# Patient Record
Sex: Female | Born: 1970 | Race: Asian | Hispanic: No | Marital: Married | Smoking: Never smoker
Health system: Southern US, Community
[De-identification: ages and names within clinical notes are randomized; demographics above are authoritative.]

## PROBLEM LIST (undated history)

## (undated) DIAGNOSIS — I10 Essential (primary) hypertension: Secondary | ICD-10-CM

---

## 2000-08-07 ENCOUNTER — Other Ambulatory Visit: Admission: RE | Admit: 2000-08-07 | Discharge: 2000-08-07 | Payer: Self-pay | Admitting: Gynecology

## 2002-03-07 ENCOUNTER — Ambulatory Visit (HOSPITAL_COMMUNITY): Admission: RE | Admit: 2002-03-07 | Discharge: 2002-03-07 | Payer: Self-pay | Admitting: Obstetrics and Gynecology

## 2002-03-07 ENCOUNTER — Encounter: Payer: Self-pay | Admitting: Obstetrics and Gynecology

## 2003-01-13 ENCOUNTER — Encounter: Payer: Self-pay | Admitting: Family Medicine

## 2003-01-13 ENCOUNTER — Ambulatory Visit (HOSPITAL_COMMUNITY): Admission: RE | Admit: 2003-01-13 | Discharge: 2003-01-13 | Payer: Self-pay | Admitting: Family Medicine

## 2003-04-21 ENCOUNTER — Emergency Department (HOSPITAL_COMMUNITY): Admission: EM | Admit: 2003-04-21 | Discharge: 2003-04-21 | Payer: Self-pay | Admitting: Emergency Medicine

## 2005-03-29 ENCOUNTER — Inpatient Hospital Stay (HOSPITAL_COMMUNITY): Admission: AD | Admit: 2005-03-29 | Discharge: 2005-03-29 | Payer: Self-pay | Admitting: Obstetrics and Gynecology

## 2005-04-16 ENCOUNTER — Encounter (INDEPENDENT_AMBULATORY_CARE_PROVIDER_SITE_OTHER): Payer: Self-pay | Admitting: *Deleted

## 2005-04-16 LAB — CONVERTED CEMR LAB

## 2005-04-24 ENCOUNTER — Ambulatory Visit: Payer: Self-pay | Admitting: Family Medicine

## 2005-05-01 ENCOUNTER — Ambulatory Visit: Payer: Self-pay | Admitting: Sports Medicine

## 2005-05-02 ENCOUNTER — Ambulatory Visit (HOSPITAL_COMMUNITY): Admission: RE | Admit: 2005-05-02 | Discharge: 2005-05-02 | Payer: Self-pay | Admitting: Obstetrics and Gynecology

## 2005-05-23 ENCOUNTER — Ambulatory Visit: Payer: Self-pay | Admitting: Family Medicine

## 2005-05-31 ENCOUNTER — Ambulatory Visit (HOSPITAL_COMMUNITY): Admission: RE | Admit: 2005-05-31 | Discharge: 2005-05-31 | Payer: Self-pay | Admitting: Family Medicine

## 2005-06-21 ENCOUNTER — Ambulatory Visit: Payer: Self-pay | Admitting: Family Medicine

## 2005-07-19 ENCOUNTER — Ambulatory Visit: Payer: Self-pay | Admitting: Family Medicine

## 2005-07-21 ENCOUNTER — Ambulatory Visit: Payer: Self-pay | Admitting: Family Medicine

## 2005-07-28 ENCOUNTER — Ambulatory Visit: Payer: Self-pay | Admitting: Family Medicine

## 2005-08-18 ENCOUNTER — Ambulatory Visit: Payer: Self-pay | Admitting: Family Medicine

## 2005-08-31 ENCOUNTER — Ambulatory Visit: Payer: Self-pay | Admitting: Family Medicine

## 2005-09-08 ENCOUNTER — Ambulatory Visit: Payer: Self-pay | Admitting: Family Medicine

## 2005-09-09 ENCOUNTER — Inpatient Hospital Stay (HOSPITAL_COMMUNITY): Admission: AD | Admit: 2005-09-09 | Discharge: 2005-09-10 | Payer: Self-pay | Admitting: Obstetrics & Gynecology

## 2005-09-17 ENCOUNTER — Ambulatory Visit: Payer: Self-pay | Admitting: *Deleted

## 2005-09-17 ENCOUNTER — Inpatient Hospital Stay (HOSPITAL_COMMUNITY): Admission: AD | Admit: 2005-09-17 | Discharge: 2005-09-20 | Payer: Self-pay | Admitting: *Deleted

## 2005-09-17 ENCOUNTER — Encounter (INDEPENDENT_AMBULATORY_CARE_PROVIDER_SITE_OTHER): Payer: Self-pay | Admitting: *Deleted

## 2005-09-21 ENCOUNTER — Encounter: Admission: RE | Admit: 2005-09-21 | Discharge: 2005-10-21 | Payer: Self-pay | Admitting: *Deleted

## 2005-10-22 ENCOUNTER — Encounter: Admission: RE | Admit: 2005-10-22 | Discharge: 2005-11-09 | Payer: Self-pay | Admitting: *Deleted

## 2005-11-07 ENCOUNTER — Ambulatory Visit: Payer: Self-pay | Admitting: Family Medicine

## 2005-12-20 ENCOUNTER — Ambulatory Visit: Payer: Self-pay | Admitting: Family Medicine

## 2006-03-30 ENCOUNTER — Ambulatory Visit: Payer: Self-pay | Admitting: Family Medicine

## 2006-04-13 ENCOUNTER — Ambulatory Visit: Payer: Self-pay | Admitting: Sports Medicine

## 2006-07-13 ENCOUNTER — Ambulatory Visit: Payer: Self-pay | Admitting: Family Medicine

## 2006-11-09 ENCOUNTER — Encounter (INDEPENDENT_AMBULATORY_CARE_PROVIDER_SITE_OTHER): Payer: Self-pay | Admitting: *Deleted

## 2007-01-10 IMAGING — US US FETAL BPP W/O NONSTRESS
1 series · 14 of 28 positions shown · non-contrast
Comparison: none

CLINICAL DATA: Abdominal pain.  No fetal movement.

[Series 1: us fetal bpp w/o nonstress · 0.39mm/px · 14 of 105 slices shown]
[im 4/105]
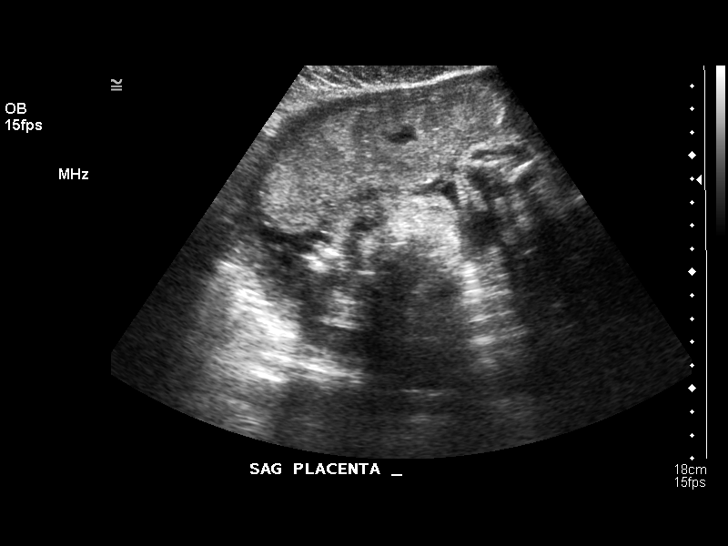
[im 12/105]
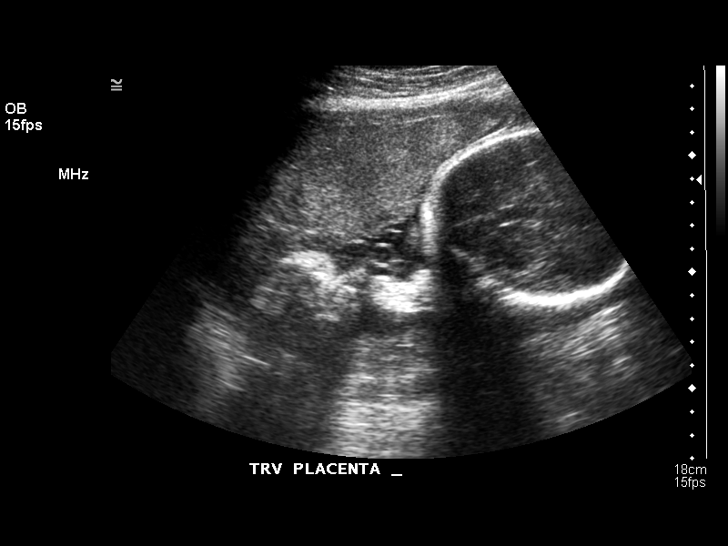
[im 20/105]
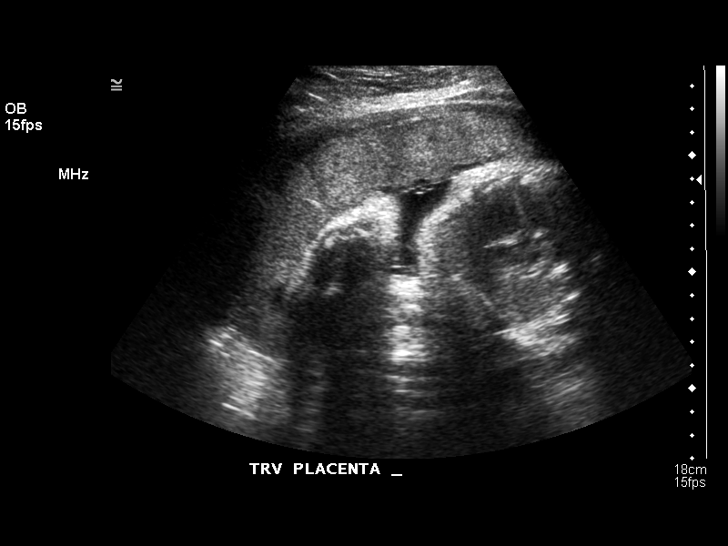
[im 27/105]
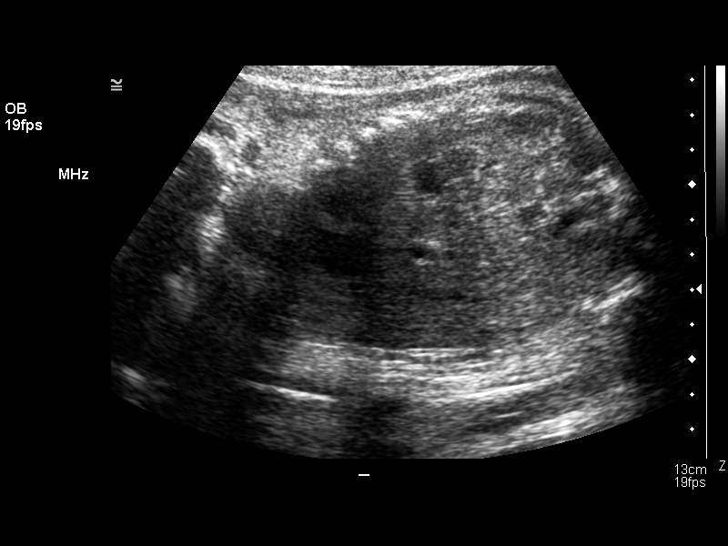
[im 35/105]
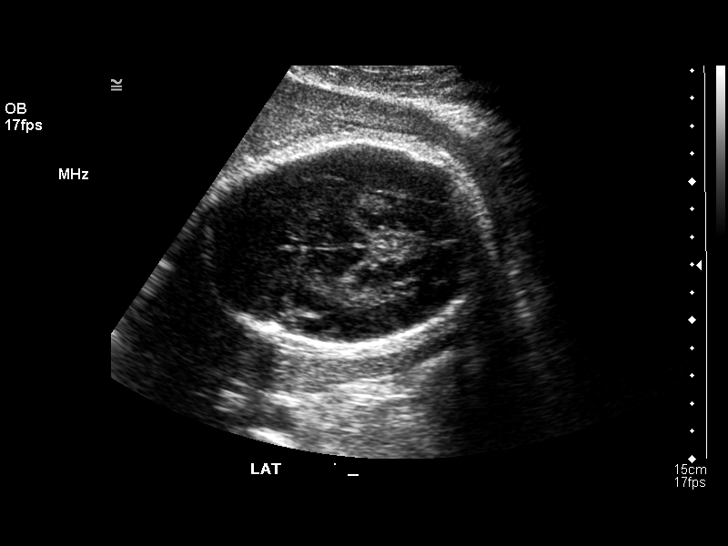
[im 43/105]
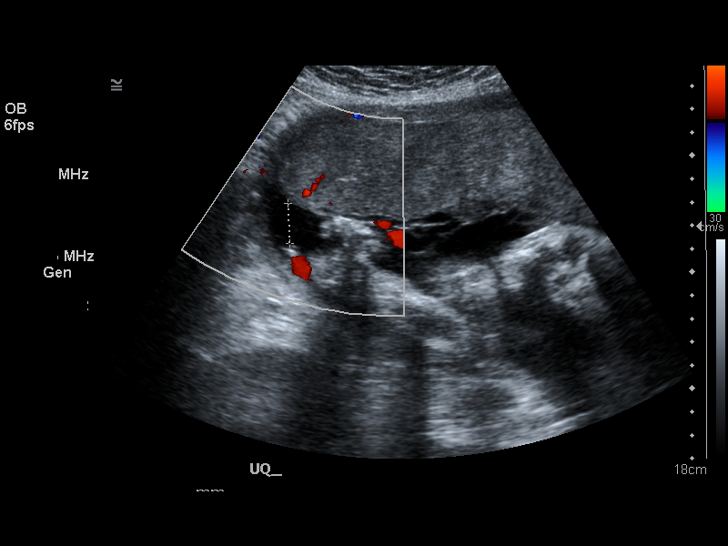
[im 51/105]
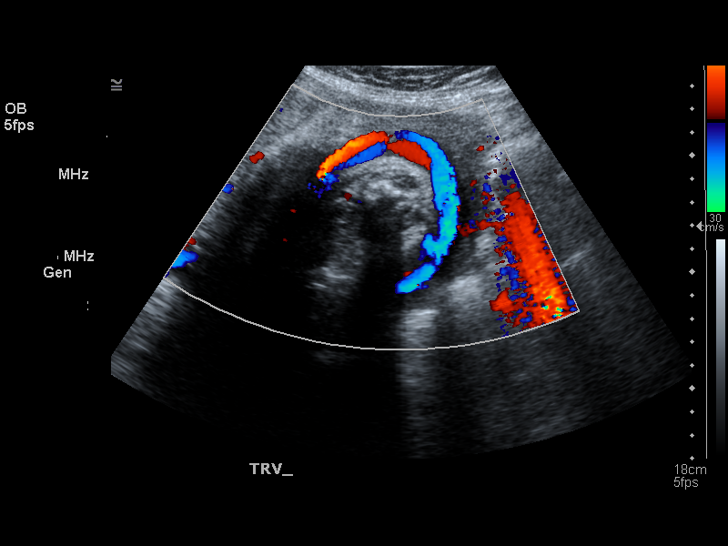
[im 58/105]
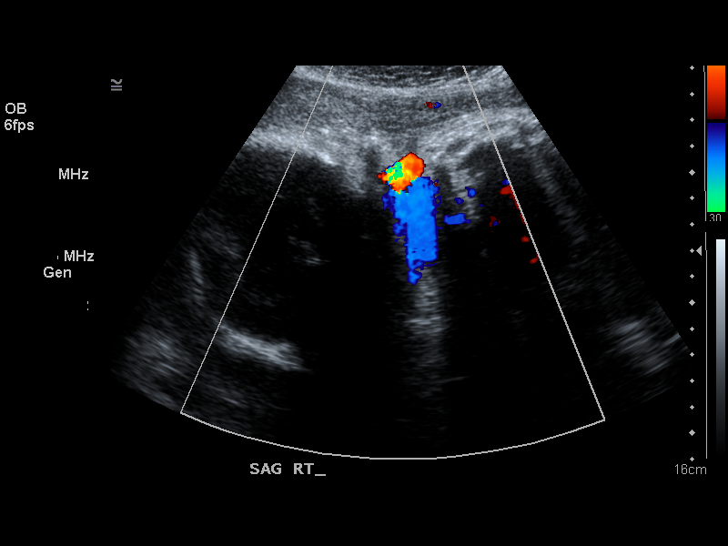
[im 66/105]
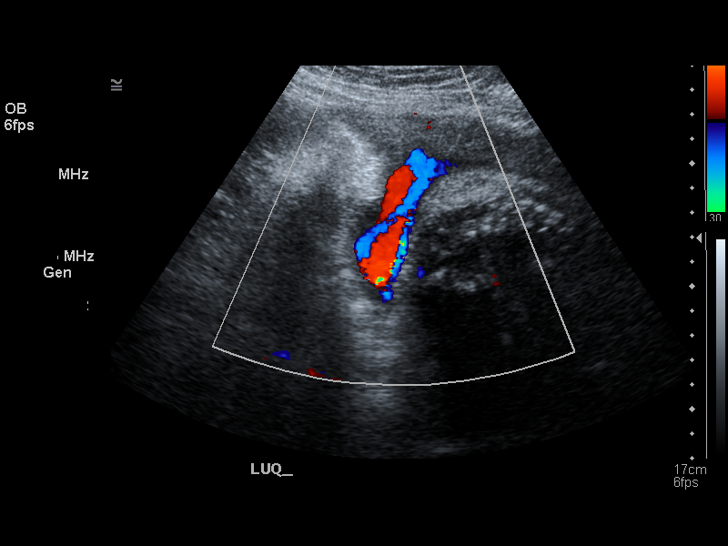
[im 74/105]
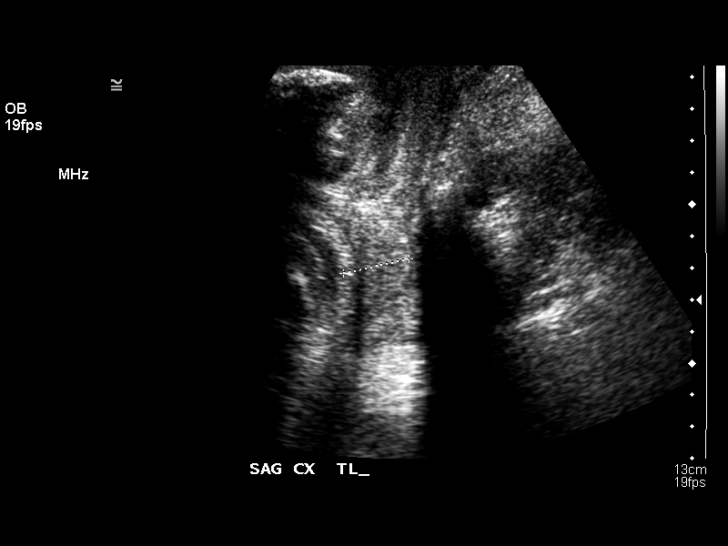
[im 81/105]
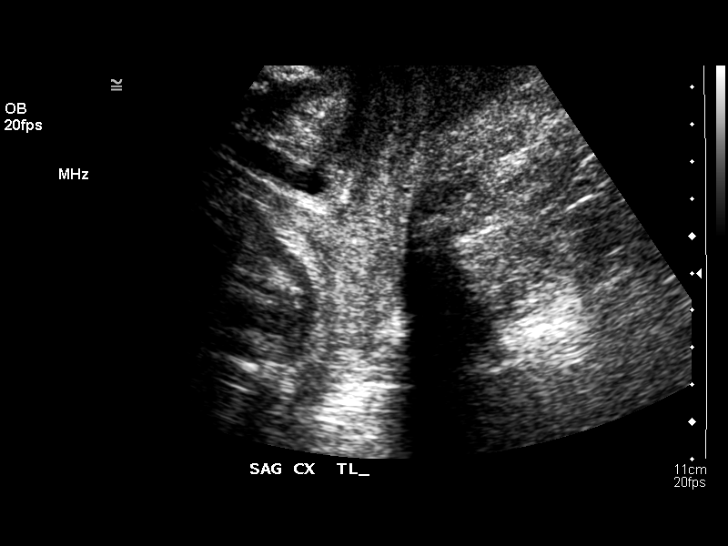
[im 89/105]
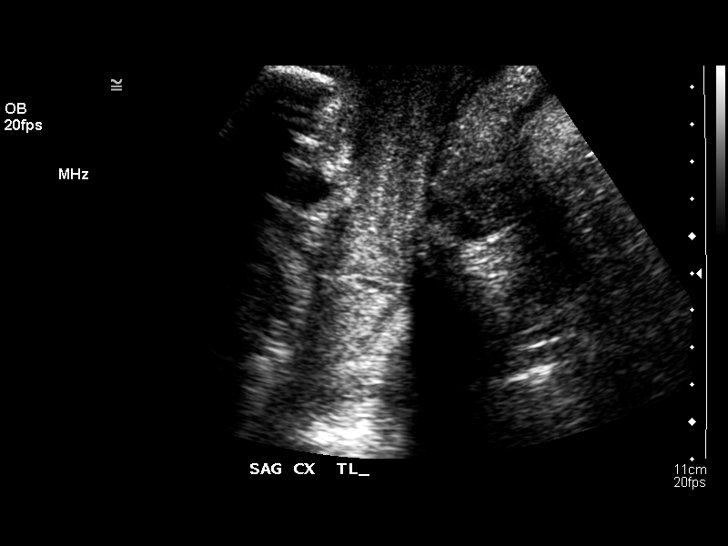
[im 97/105]
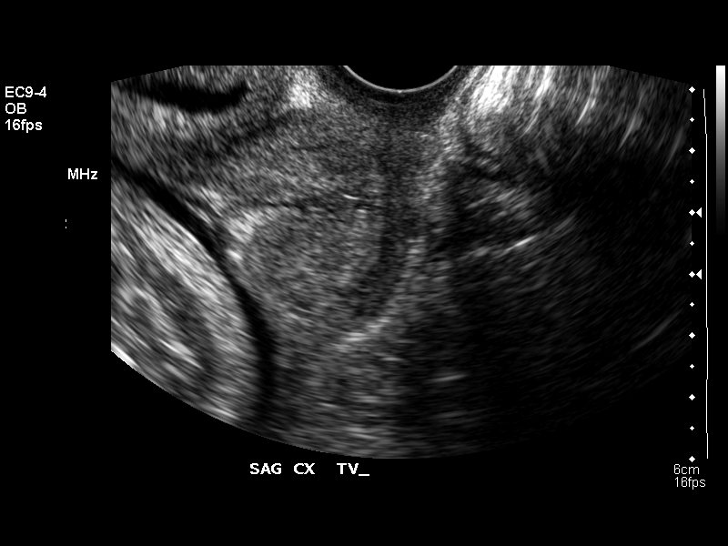
[im 105/105]
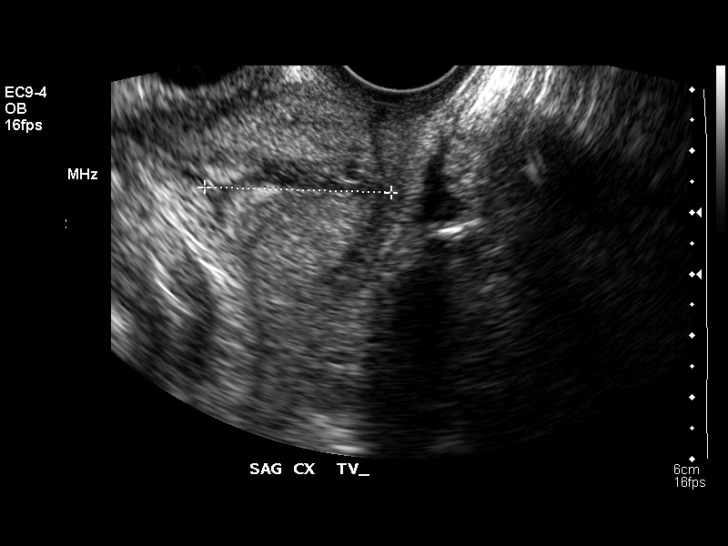

[14 of 28 positions shown; findings below may reference images not displayed]

BIOPHYSICAL PROFILE AND TRANSVAGINAL OB US:

 Number of Fetuses:  1
 Heart rate:  141
 Movement:  Yes
 Breathing:  Yes
 Presentation:  Breech
 Placental Location:  Anterior
 Grade:  I
 Previa:  No
 Amniotic Fluid (Subjective):  Decreased
 Amniotic Fluid (Objective):  6.25 cm AFI (5th -95th%ile = 8.3 ? 24.5 cm for 33 wks) ? Below the 5th percentile.

 Fetal measurements and complete anatomic evaluation were not requested.  The following fetal anatomy was visualized on this exam: Lateral ventricles, thalami, four chamber heart, stomach, kidneys and bladder. 

 BPP SCORING
 Movements: 2  Time:  20 minutes
 Breathing:  2
 Tone:  2
 Amniotic Fluid:  2
 Total Score:  8

 MATERNAL UTERINE AND ADNEXAL FINDINGS
 Cervix: 3.0 cm Transvaginally
IMPRESSION: Biophysical profile score is [DATE].  However, there is decreased amniotic fluid volume.

## 2007-01-10 IMAGING — US US OB FOLLOW-UP
1 series · 13 of 28 positions shown · non-contrast
Comparison: none

CLINICAL DATA: Estimated fetal weight.  Decreased AFI.

[Series 1: us ob follow-up · 0.25mm/px · 13 of 37 slices shown]
[im 2/37]
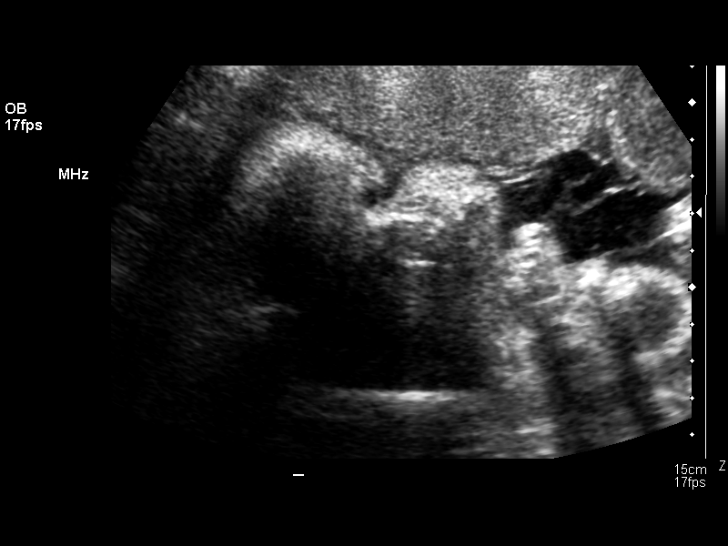
[im 5/37]
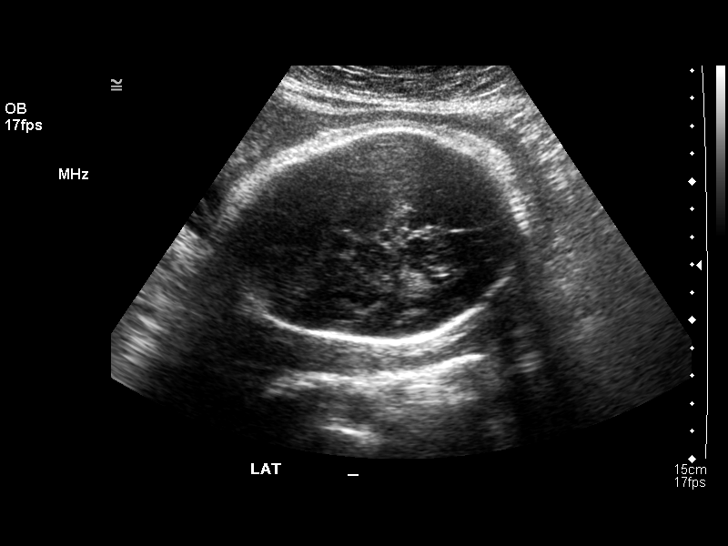
[im 7/37]
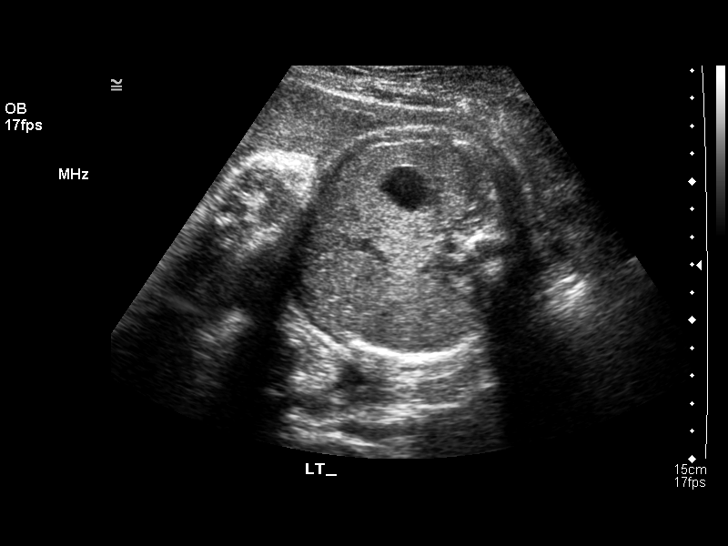
[im 10/37]
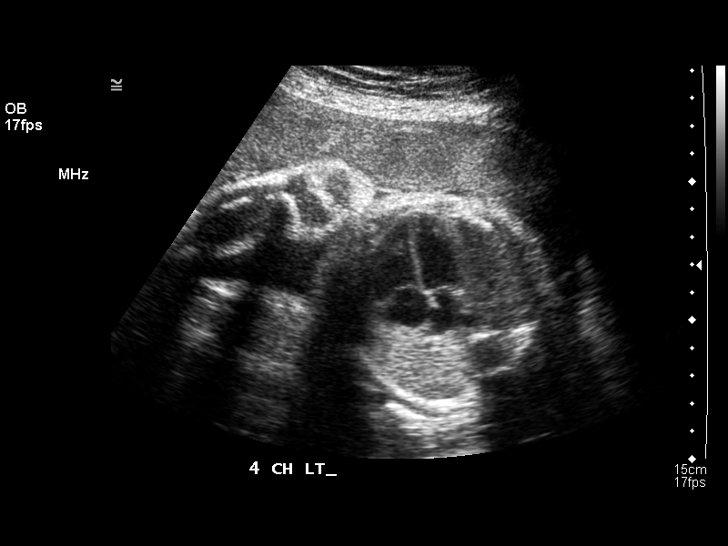
[im 13/37]
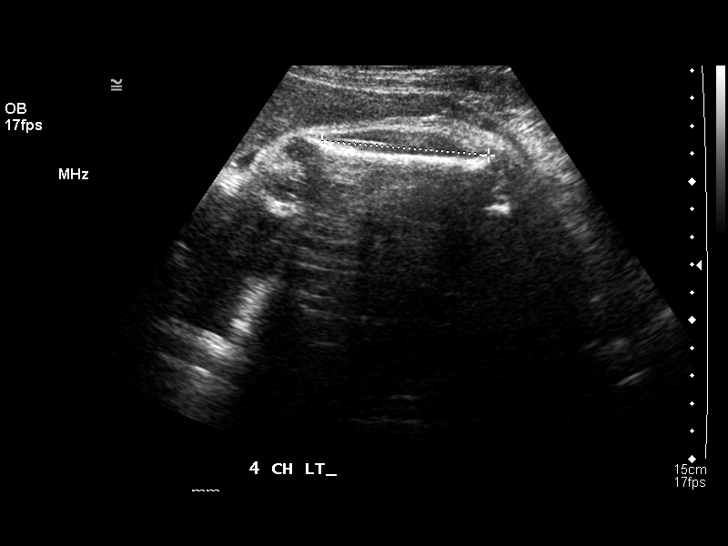
[im 15/37]
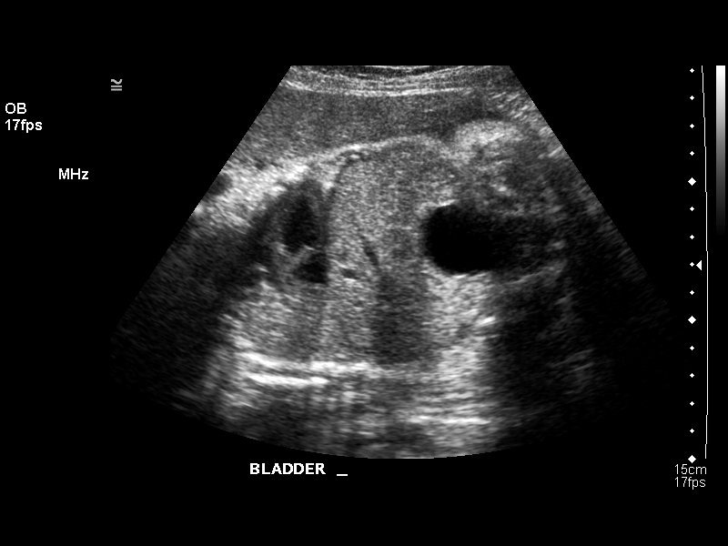
[im 19/37]
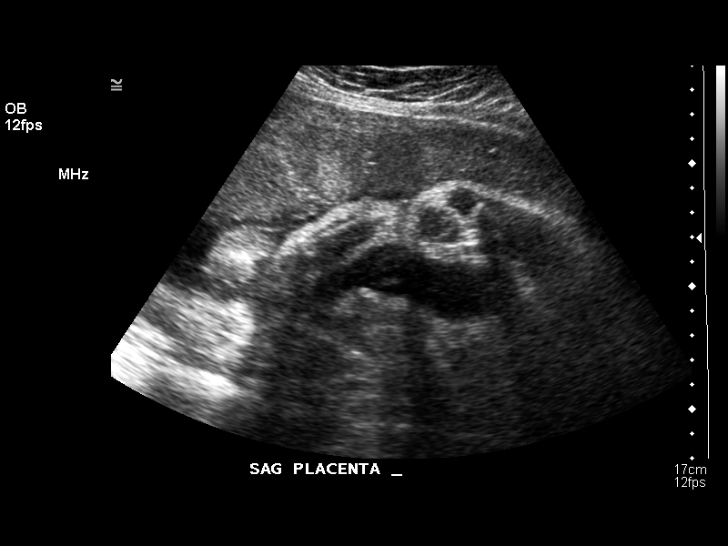
[im 22/37]
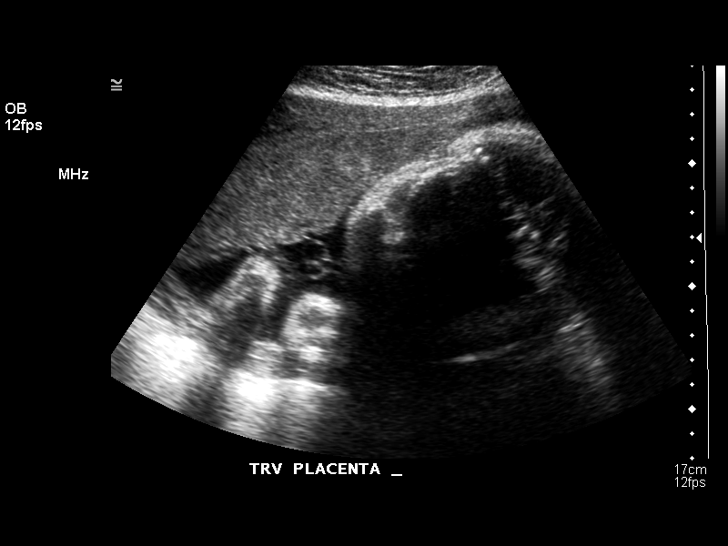
[im 25/37]
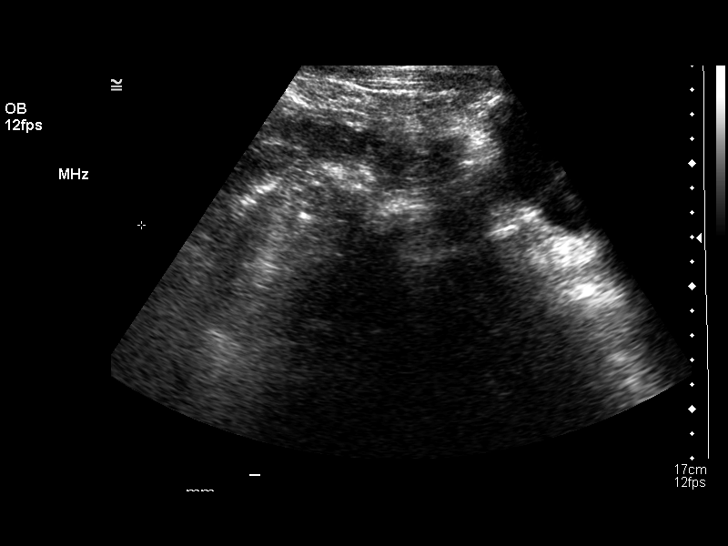
[im 27/37]
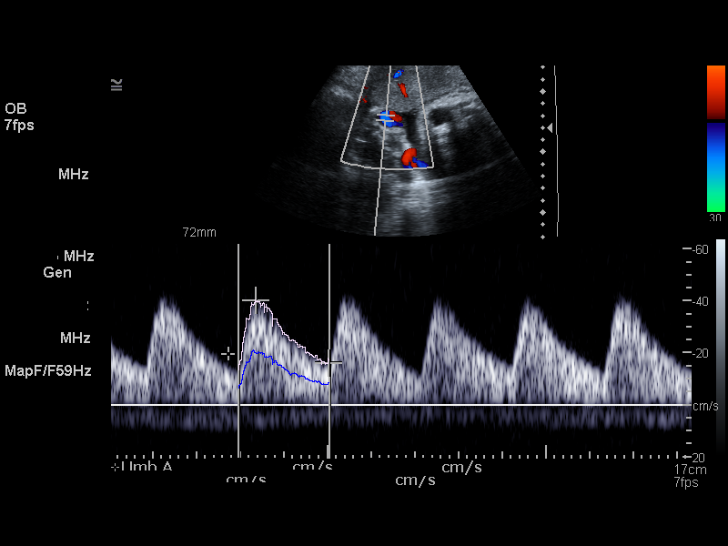
[im 30/37]
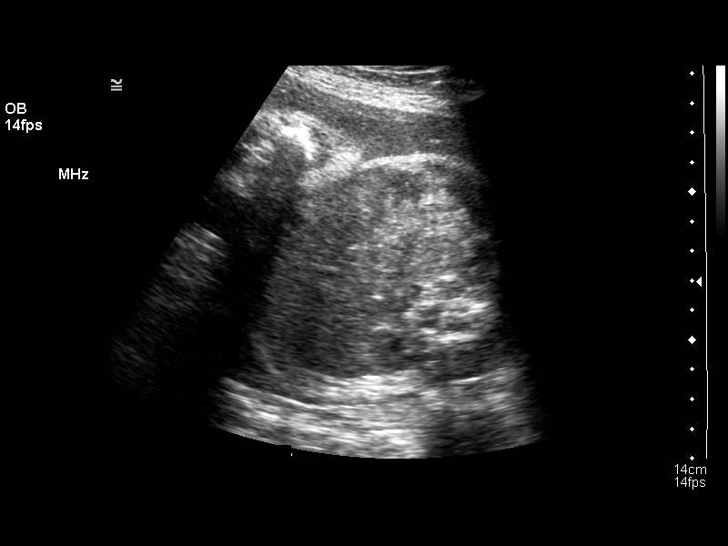
[im 33/37]
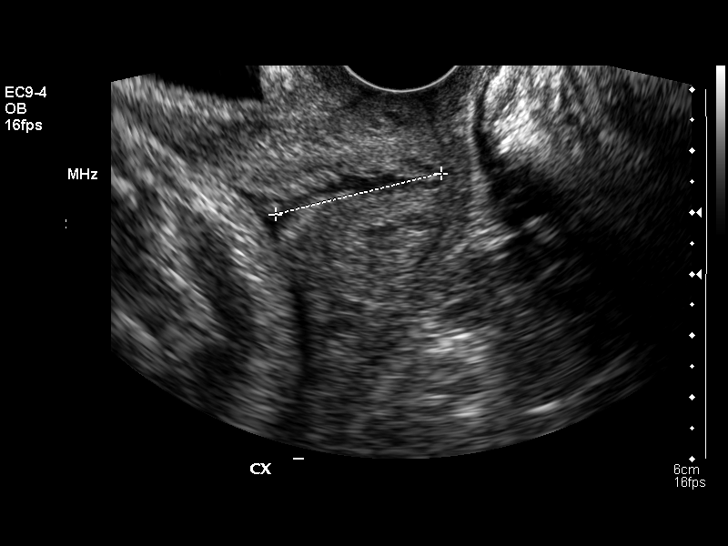
[im 35/37]
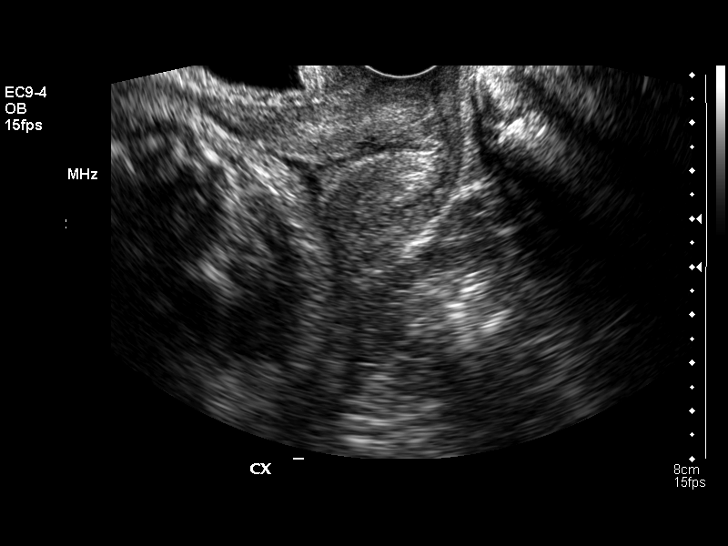

[13 of 28 positions shown; findings below may reference images not displayed]

OBSTETRICAL ULTRASOUND RE-EVALUATION:
Number of Fetuses:  1
Heart Rate:  136
Movement:  Yes
Breathing:  Yes
Presentation:  Breech
Placental Location:  Anterior
Grade:  I
Previa:  No
Amniotic Fluid (subjective):  Decreased
Amniotic Fluid (objective):  5.6 cm AFI (5th -95th%ile = 8.3 ? 24.5 cm for 33 wks)

FETAL BIOMETRY
BPD:  7.7 cm   31 w 0 d
HC:  30.7 cm  34 w 2 d
AC:  28.3 cm   32 w 2 d
FL:  6.0 cm   31 w 1 d

Mean GA:  32 w 1 d  US EDC:  11/02/05
Assigned GA:  33 w 3 d    Assigned EDC:  10/24/05

EFW:  7077 g (S) 25th ? 50th%ile (0010 ? 1511 g) For 33 wks

FETAL ANATOMY
Lateral Ventricles:  Visualized 
Thalami/CSP:  Visualized 
Posterior Fossa:  Previously seen 
Nuchal Region:  Previously seen 
Spine:  Previously seen 
4 Chamber Heart on Left:  Visualized  
Stomach on Left:  Visualized  
3 Vessel Cord:  Previously seen  
Cord Insertion Site:  Previously seen 
Kidneys:  Visualized 
Bladder:  Visualized 
Extremities:  Previously seen 

MATERNAL UTERINE AND ADNEXAL FINDINGS
Cervix: 3.5 cm Transvaginally

DOPPLER ULTRASOUND OF FETUS:

Umbilical Artery S/D Ratio:  3.10    (NL< 3.70)
IMPRESSION: 1.  Single live intrauterine gestation in breech presentation with appropriate interval growth with compared to prior study.  Average ultrasound age today 32 weeks 1 day corresponding to a due date of 11/02/05, which is concordant with the gestational age by first ultrasound of 33 weeks 3 days (EDC 10/24/05).  The fetus is again dolichocephalic, although this may be in part accentuated numerically by shorter femur length which may be attributable to the patient?s ethnicity (Asian). 
2.  Oligohydramnios is again noted. 
3.  Normal umbilical cord Doppler.

## 2007-01-12 IMAGING — US US OB LIMITED
1 series · 14 of 18 positions shown · non-contrast
Comparison: OB ultrasound 09/08/05.

CLINICAL DATA: No fetal movement, abdominal pain.  

LIMITED OBSTETRICAL ULTRASOUND:

[Series 1: us ob limited · 0.31mm/px · 14 of 18 slices shown]
[im 1/18]
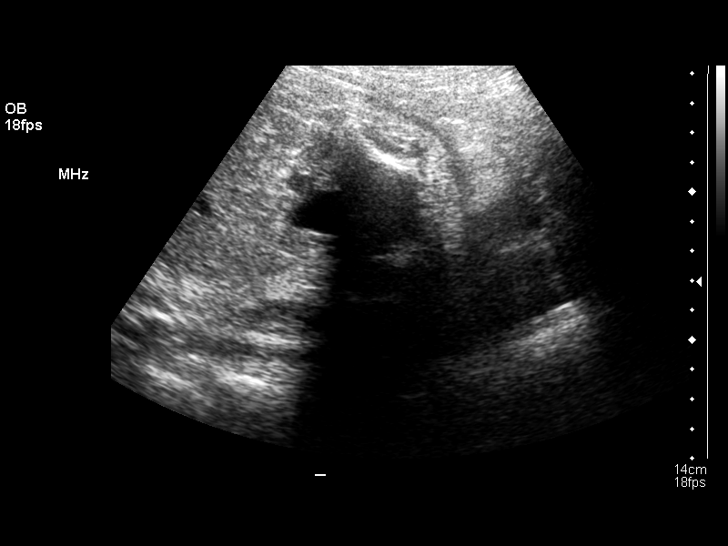
[im 2/18]
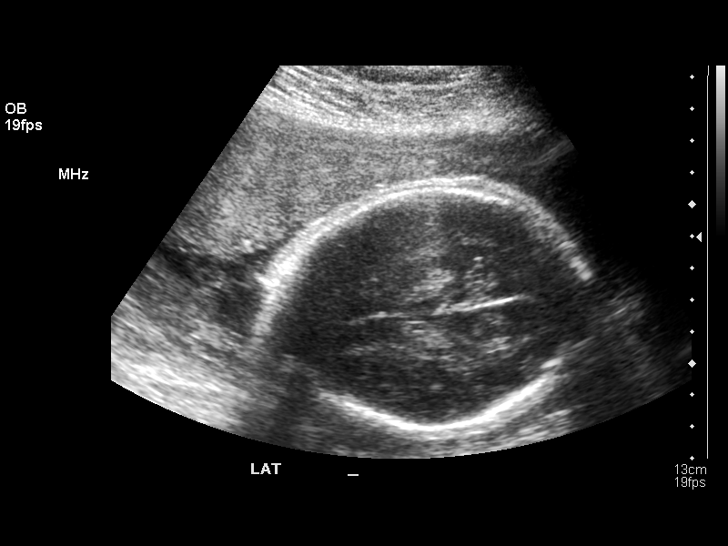
[im 4/18]
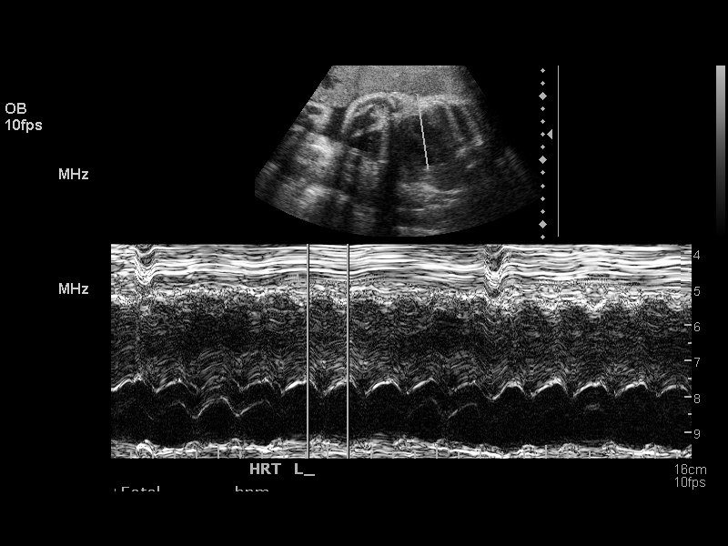
[im 5/18]
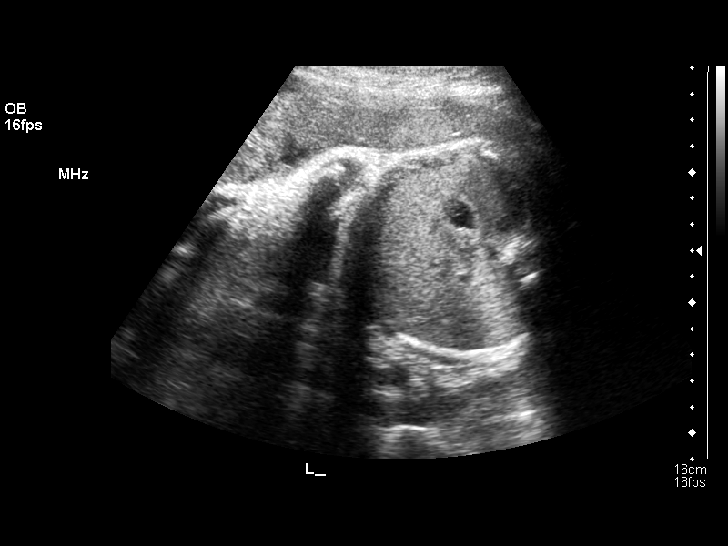
[im 6/18]
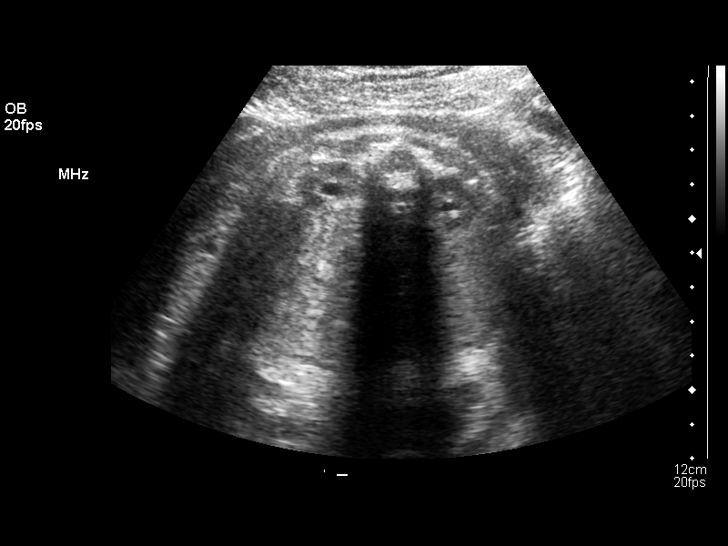
[im 8/18]
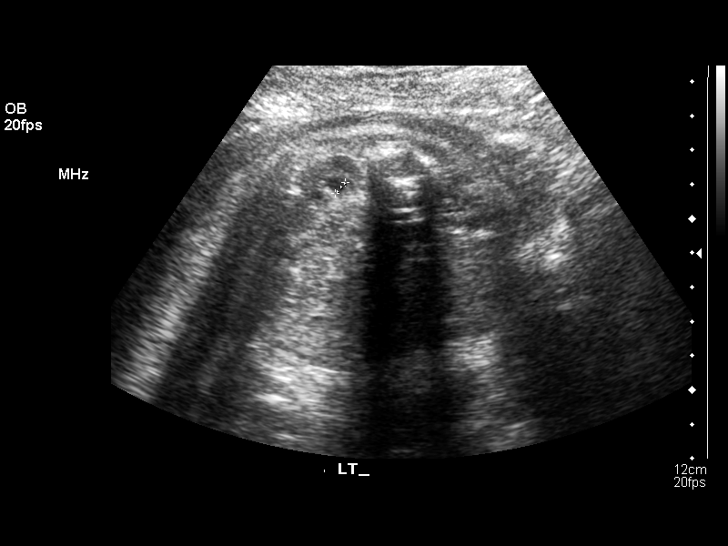
[im 9/18]
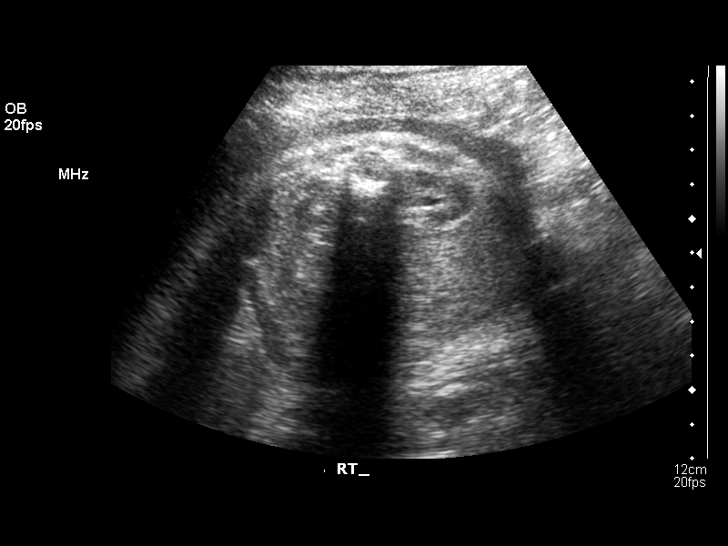
[im 10/18]
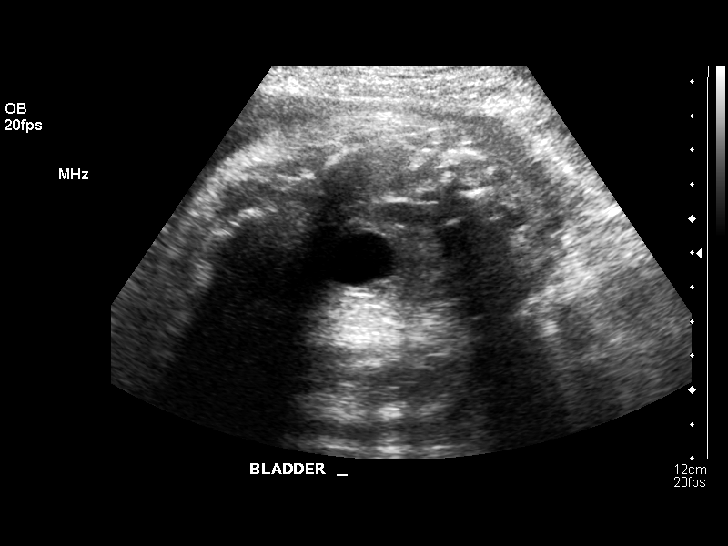
[im 11/18]
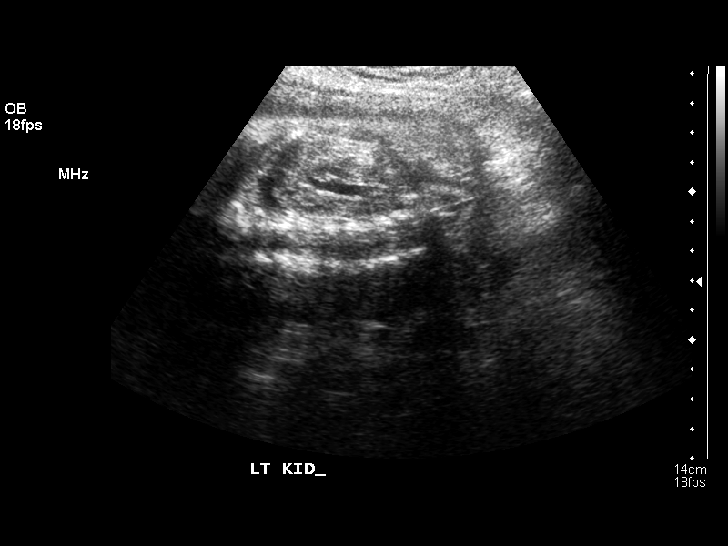
[im 13/18]
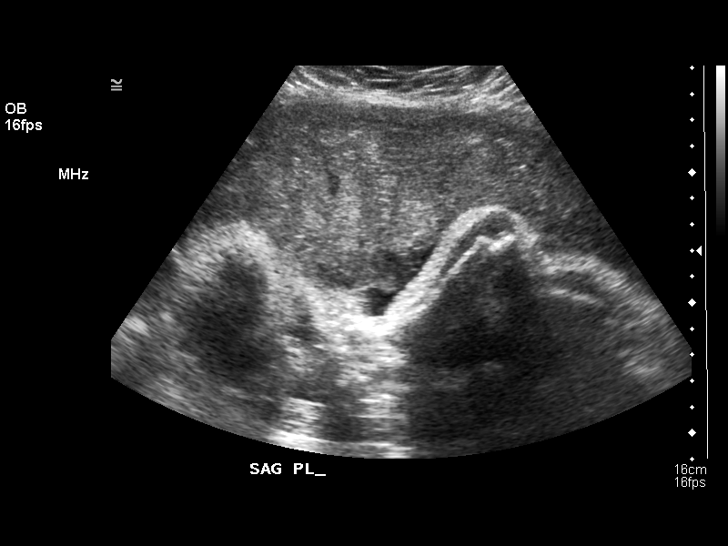
[im 14/18]
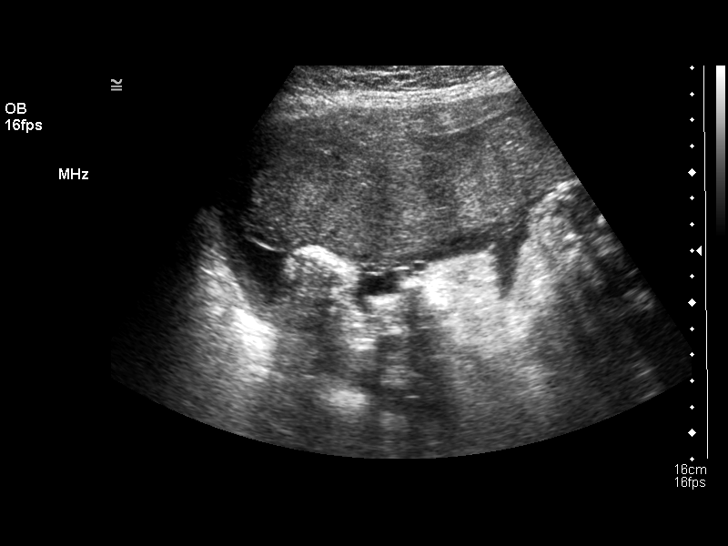
[im 15/18]
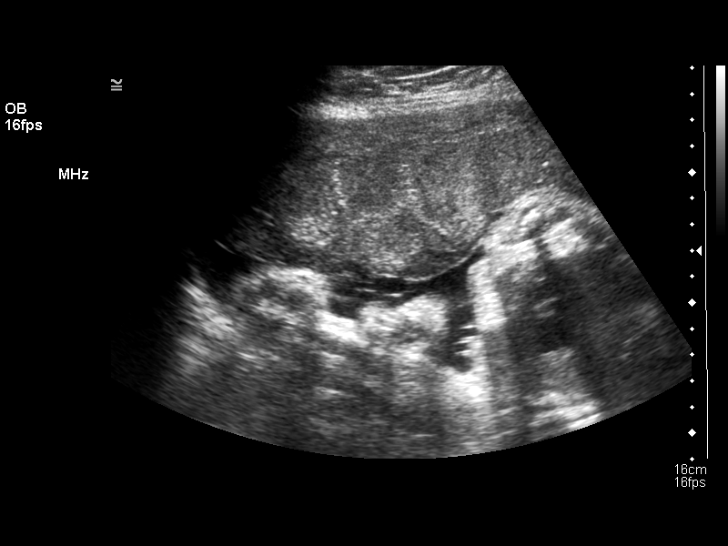
[im 17/18]
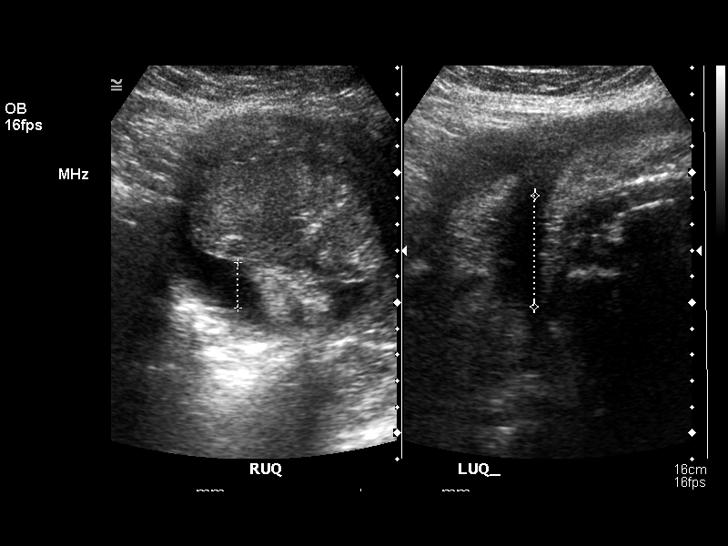
[im 18/18]
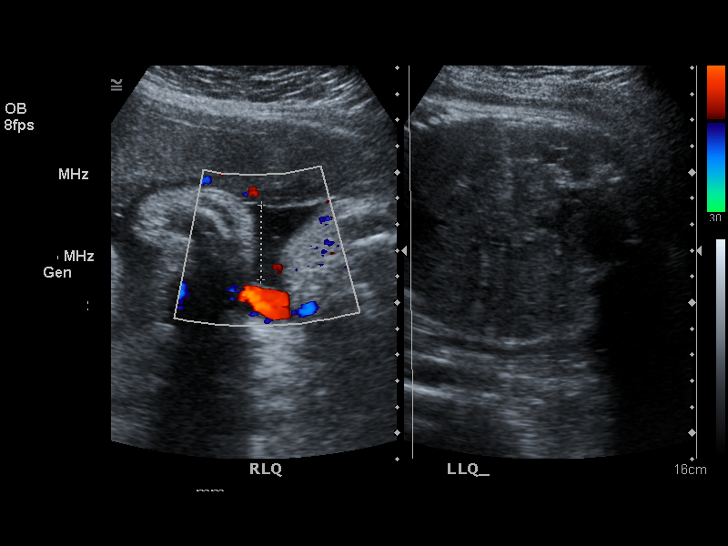

[14 of 18 positions shown; findings below may reference images not displayed]

Number of Fetuses:  1
Heart Rate:  163
Movement:  Yes
Breathing:  Yes
Presentation:  Breech
Placental Location:  Anterior
Grade: II
Previa:  No
Amniotic Fluid (Subjective):  Low normal
Amniotic Fluid (Objective):  8.9 cm AFI (5th -95th%ile = 8.1 ? 24.8 cm for 34 wks)

Fetal measurements and complete anatomic evaluation were not requested.  The following fetal anatomy was visualized during this exam:  Lateral ventricles, stomach, kidneys and bladder.

MATERNAL UTERINE AND ADNEXAL FINDINGS
Cervix:  Not evaluated.  

BIOPHYSICAL PROFILE

Movement:  2      Time:  10 minutes
Breathing:  2
Tone:  
Amniotic Fluid:  2

Total Score:  8
IMPRESSION: 1.  BPP [DATE] over 10 minutes.
2.  Single live intrauterine gestation in breech presentation.  Amniotic fluid volume is at the lower limits for normal versus mild oligohydramnios.

## 2009-02-10 ENCOUNTER — Inpatient Hospital Stay (HOSPITAL_COMMUNITY): Admission: AD | Admit: 2009-02-10 | Discharge: 2009-02-12 | Payer: Self-pay | Admitting: Obstetrics

## 2009-02-14 ENCOUNTER — Inpatient Hospital Stay (HOSPITAL_COMMUNITY): Admission: AD | Admit: 2009-02-14 | Discharge: 2009-02-14 | Payer: Self-pay | Admitting: Obstetrics

## 2010-12-19 LAB — CBC
HCT: 40.6 % (ref 36.0–46.0)
Hemoglobin: 11.6 g/dL — ABNORMAL LOW (ref 12.0–15.0)
Hemoglobin: 13.9 g/dL (ref 12.0–15.0)
MCHC: 34.2 g/dL (ref 30.0–36.0)
Platelets: 172 10*3/uL (ref 150–400)
RDW: 13.4 % (ref 11.5–15.5)
RDW: 13.8 % (ref 11.5–15.5)

## 2010-12-19 LAB — DIFFERENTIAL
Basophils Absolute: 0 10*3/uL (ref 0.0–0.1)
Eosinophils Relative: 1 % (ref 0–5)
Lymphocytes Relative: 23 % (ref 12–46)
Monocytes Absolute: 1.2 10*3/uL — ABNORMAL HIGH (ref 0.1–1.0)
Monocytes Relative: 10 % (ref 3–12)

## 2010-12-19 LAB — RAPID HIV SCREEN (WH-MAU): Rapid HIV Screen: NONREACTIVE

## 2010-12-19 LAB — TYPE AND SCREEN: ABO/RH(D): O POS

## 2010-12-19 LAB — ABO/RH: ABO/RH(D): O POS

## 2011-01-27 NOTE — Discharge Summary (Signed)
[name], Parsons                  ACCOUNT NO.:  [id number]   MEDICAL RECORD NO.:  [id number]          PATIENT TYPE:  INP   LOCATION:  9130                          FACILITY:  [facility]   PHYSICIAN:  Ariana Parsons, M.D.DATE OF BIRTH:  Sep 02, 1971   DATE OF ADMISSION:  09/17/2005  DATE OF DISCHARGE:  09/20/2005                                 DISCHARGE SUMMARY   HOSPITAL CARE TEAM:  Obstetrical teaching service   DIAGNOSES:  1.  Intrauterine pregnancy at 34-5/7 weeks.  2.  Rupture of amniotic membranes.  3.  Breech presentation.   SUMMARY OF LABORATORY STUDIES:  White blood cells were 14.8, hemoglobin at  discharge 12.9, platelets 199.  Sodium 135, potassium 3.9, creatinine 0.5,  BUN 2, albumin 2.9, AST 24, ALT 16, alkaline phosphatase 121, total  bilirubin 0.4, LDH 120, uric acid 4.2.  RPR was not reactive.  Amniotic  fluid analysis for lecithin is pending at time of discharge.  Blood type was  O+.  Rubella is immune.  Hepatitis B surface antigen negative.  Syphilis is  nonreactive.  HIV is negative.  GC negative.  Chlamydia negative.   HOSPITAL COURSE:  This was a 40 year old G1, P0 at 34-5/7 weeks who came in  for rupture of membranes and had an ultrasound about a week previous that  showed breech position.  This was confirmed by a bedside ultrasound.  On  January 4 confirmed that the patient had frank breech and low amniotic fluid  of 8.9, but no fetal anomalies.  Patient was having mild contractions at  that time and initially it was thought that we would give her a trial of  labor as it is possible to deliver vaginally a frank breech at 35 weeks.  However, after further thought the patient decided she would like to have  low transverse cesarean section so we went on to do a low transverse  cesarean section with manual removal of placenta under spinal anesthesia.  A  viable female was delivered with a three vessel cord, initial Apgars at one  and five minutes were 6 and 9,  respectively.  Postoperative course was  unremarkable.  The patient was both breast and bottle feeding.  Plan is to  receive Depo prior to discharge for birth control at the current time.  Pain  was well controlled with ibuprofen.   FOLLOW-UP:  She will need to follow up in six weeks at the family practice  center where she got her prenatal care by Dr. Raquel James.   DISCHARGE MEDICATIONS:  1.  Ibuprofen 600 mg q.6h. p.r.n. pain.  2.  Prenatal vitamins.   DISCHARGE DIET:  Increase as tolerated.   DISCHARGE ACTIVITIES:  Increase as tolerated.     ______________________________  Ariana Parsons    ______________________________  Ariana Parsons, M.D.    JT/MEDQ  D:  09/20/2005  T:  09/20/2005  Job:  [id number]

## 2011-01-27 NOTE — Discharge Summary (Signed)
Ariana Parsons, Ariana Parsons                  ACCOUNT NO.:  [id number]   MEDICAL RECORD NO.:  [id number]          PATIENT TYPE:  OBV   LOCATION:  9159                          [facility]ILITY[facility]   PHYSICIAN:  Tanya S. Shawnie Pons, M.D.   DATE OF BIRTH:  Apr 29, 1971   DATE OF ADMISSION:  09/08/2005  DATE OF DISCHARGE:  09/10/2005                                 DISCHARGE SUMMARY   FINAL DIAGNOSES:  1.  Intrauterine pregnancy at 33-5/7 weeks.  2.  Decreased fetal movement.  3.  Decreased amniotic fluid index.  4.  Breech presentation.  5.  Mild proteinuria.   LABORATORY DATA:  A 24-hour urine had volume of 2750 and a protein of 303.  Urine culture was negative.  Urine drug screen negative.  White count of  10.3, hemoglobin 13.7, platelets 250.  Creatinine 0.5, ALT 19, AST 25, LDH  103, uric acid 4.3.  Pertinent x-rays include growth scan that revealed the  fetus to be 32-1/7, approximately one week behind the assigned gestational  age with a very delicate cephalic head.  Fluid was initially 5.6 on that  scan.  She had normal Dopplers and BPP of 8 out of 8.  A repeat BPP on the  day of discharge showed the fluid to be up to 8.9 and to look low normal by  scan. Her cervix was noted to be 3.5 cm long.  The patient had left renal  pyelectasis of 4.3 mm and __________ of 808.   HOSPITAL COURSE:  Her reason for admission briefly, the patient is a 40-year-  old, gravida 1 who presented at 38 and 3, who was a patient of Dr. Victorino Dike  Turnbull's who came in with decreased fetal movement for four hours.  At  that time she had a reactive NST and BPP of 8 of 8. However, the fluid was  low at 6.25.  She was admitted and followup the next day revealed the  aforementioned appropriate growth of the fetus as well as lower fluid at  5.6.  At that point a PIH panel was ordered as well as 24-hour urine and the  patient received betamethasone x2.  The patient remained in the hospital on  a monitor for 48 hours at which time  she had a very, very strong fetal heart  rate status, reactive tracing with positive accelerations throughout.  No  significant decelerations noted and she had serial blood pressures that were  always within the 106-124/49-74 range with most of her blood pressures being  in the 110's/60's.  Her urine did reveal 303 mg of protein but she had no  other signs of proteinuria or labs that were suggestive of preeclampsia.  Her blood pressure remained normal.  She had a very, very strong fetal heart  rate status and given the fact that her fluid was up to 8.9, it was felt she  was stable for discharge.   CONDITION ON DISCHARGE:  The patient was discharged home in good condition.   FOLLOW UP:  At the high-risk clinic where she will undergo twice weekly  testing  for fluid.  The patient was instructed in fetal kick counts and to  return if she does not feel like the baby is doing well.   DISCHARGE MEDICATIONS:  Prenatal vitamins.          ______________________________  Shelbie Proctor Shawnie Pons, M.D.    DGU/YQIH  D:  09/10/2005  T:  09/11/2005  Job:  [id number]

## 2011-01-27 NOTE — Op Note (Signed)
Ariana Parsons, Ariana Parsons                  ACCOUNT NO.:  [id number]   MEDICAL RECORD NO.:  [id number]          PATIENT TYPE:  INP   LOCATION:  9130                          [facility]ILITY[facility]   PHYSICIAN:  Conni Elliot, M.D.DATE OF BIRTH:  10/20/1970   DATE OF PROCEDURE:  09/17/2005  DATE OF DISCHARGE:                                 OPERATIVE REPORT   PREOPERATIVE DIAGNOSIS:  Homero Fellers breech, in active labor.   POSTOPERATIVE DIAGNOSIS:  Homero Fellers breech, in active labor.   PROCEDURE:  Low transverse cesarean section.   ANESTHESIA:  Spinal.   OPERATOR:  Conni Elliot, M.D.   PROCEDURE:  After __________  position and was prepped and draped in a  sterile fashion.  A low transverse Pfannenstiel incision was made, incision  made in the skin and fascia, rectus muscles separated off and peritoneum  identified and entered.  The bladder flap created.  A low transverse uterine  incision was made.  The incision was extended on the right to be sure there  was plenty of room for the aftercoming head.  The baby was delivered from a  frank breech presentation without difficulty.  There was no entrapment of  the fetal head and no delay in delivery.  The cord doubly clamped and cut  and the baby handed to the neonatologist in attendance.  The placenta was  delivered spontaneously.  The uterus, bladder flap, anterior peritoneum,  fascia and skin were closed in the usual fashion.  Estimated blood loss was  less than 1000 mL.           ______________________________  Conni Elliot, M.D.     ASG/MEDQ  D:  09/17/2005  T:  09/18/2005  Job:  [id number]

## 2012-05-31 ENCOUNTER — Ambulatory Visit (INDEPENDENT_AMBULATORY_CARE_PROVIDER_SITE_OTHER): Payer: BC Managed Care – PPO | Admitting: Emergency Medicine

## 2012-05-31 VITALS — BP 118/76 | HR 87 | Temp 98.0°F | Resp 16 | Ht 61.0 in | Wt 134.0 lb

## 2012-05-31 DIAGNOSIS — R112 Nausea with vomiting, unspecified: Secondary | ICD-10-CM

## 2012-05-31 MED ORDER — ONDANSETRON 4 MG PO TBDP: 4.0000 mg | ORAL_TABLET | Freq: Three times a day (TID) | ORAL | Status: DC | PRN

## 2012-05-31 NOTE — Progress Notes (Signed)
   Date:  05/31/2012   Name:  Ariana Parsons   DOB:  09/27/1970   MRN:  [id number] Gender: female Age: 41 y.o.  PCP:  No primary provider on file.    Chief Complaint: Emesis, Diarrhea and Dizziness   History of Present Illness:  Ariana Parsons is a 41 y.o. pleasant patient who presents with the following:  Nausea and vomiting.  Requests pregnancy test.  Sexually active and no contraceptive.  G2P2.  Last was by CSection.  Yesterday vomited 3 times.  No stool change.  No fever or chills.  Anorectic.  No dysuria, urgency, frequency.  No URI symptoms.  Translator says that she is eating and taking liquids although she is not hungry.  She feels hot and cold but has no history of documented temperature elevation.  No prior episode like this.  LMP 8/20.  There is no problem list on file for this patient.   No past medical history on file.  No past surgical history on file.  History  Substance Use Topics  . Smoking status: Never Smoker   . Smokeless tobacco: Not on file  . Alcohol Use: Not on file    No family history on file.  No Known Allergies  Medication list has been reviewed and updated.  No outpatient prescriptions prior to visit.    Review of Systems:  As per HPI, otherwise negative.    Physical Examination: Filed Vitals:   05/31/12 1112  BP: 118/76  Pulse: 87  Temp: 98 F (36.7 C)  Resp: 16   Filed Vitals:   05/31/12 1112  Height: 5\' 1"  (1.549 m)  Weight: 134 lb (60.782 kg)   Body mass index is 25.32 kg/(m^2). Ideal Body Weight: Weight in (lb) to have BMI = 25: 132   GEN: WDWN, NAD, Non-toxic, A & O x 3.  Not icteric or septic.  Well hydrated. HEENT: Atraumatic, Normocephalic. Neck supple. No masses, No LAD.  Oropharynx negative Ears and Nose: No external deformity.TM negative CV: RRR, No M/G/R. No JVD. No thrill. No extra heart sounds. PULM: CTA B, no wheezes, crackles, rhonchi. No retractions. No resp. distress. No accessory muscle use. ABD: S, NT, ND,  +BS. No rebound. No HSM. EXTR: No c/c/e NEURO Normal gait.  PSYCH: Normally interactive. Conversant. Not depressed or anxious appearing.  Calm demeanor. \   Assessment and Plan: Nausea and vomiting Pregnancy test UA    Carmelina Dane, MD   I have reviewed and agree with documentation. Robert P. Merla Riches, M.D.

## 2015-04-19 ENCOUNTER — Ambulatory Visit: Payer: Self-pay

## 2015-04-20 ENCOUNTER — Ambulatory Visit: Payer: Self-pay | Attending: Internal Medicine

## 2015-04-23 ENCOUNTER — Ambulatory Visit: Payer: Self-pay | Attending: Internal Medicine

## 2016-07-21 ENCOUNTER — Ambulatory Visit
Admission: RE | Admit: 2016-07-21 | Discharge: 2016-07-21 | Disposition: A | Discharge status: Home / Self Care | Payer: PRIVATE HEALTH INSURANCE | Source: Ambulatory Visit | Attending: Urgent Care | Admitting: Urgent Care

## 2016-07-21 ENCOUNTER — Ambulatory Visit (INDEPENDENT_AMBULATORY_CARE_PROVIDER_SITE_OTHER): Payer: PRIVATE HEALTH INSURANCE | Admitting: Urgent Care

## 2016-07-21 ENCOUNTER — Ambulatory Visit (INDEPENDENT_AMBULATORY_CARE_PROVIDER_SITE_OTHER): Payer: PRIVATE HEALTH INSURANCE

## 2016-07-21 VITALS — BP 128/86 | HR 63 | Temp 97.9°F | Resp 16 | Ht 61.0 in | Wt 144.3 lb

## 2016-07-21 DIAGNOSIS — R1030 Lower abdominal pain, unspecified: Secondary | ICD-10-CM | POA: Diagnosis not present

## 2016-07-21 LAB — POCT CBC
Granulocyte percent: 63.5 %G (ref 37–80)
HEMATOCRIT: 43.7 % (ref 37.7–47.9)
HEMOGLOBIN: 15.4 g/dL (ref 12.2–16.2)
LYMPH, POC: 2.7 (ref 0.6–3.4)
MCH, POC: 28.2 pg (ref 27–31.2)
MCHC: 35.3 g/dL (ref 31.8–35.4)
MCV: 19.8 fL — AB (ref 80–97)
MID (CBC): 0.6 (ref 0–0.9)
MPV: 8.6 fL (ref 0–99.8)
POC GRANULOCYTE: 5.8 (ref 2–6.9)
POC LYMPH %: 29.5 % (ref 10–50)
POC MID %: 7 % (ref 0–12)
Platelet Count, POC: 247 10*3/uL (ref 142–424)
RBC: 5.48 M/uL (ref 4.04–5.48)
RDW, POC: 13.8 %
WBC: 9.1 10*3/uL (ref 4.6–10.2)

## 2016-07-21 LAB — POCT URINALYSIS DIP (MANUAL ENTRY)
BILIRUBIN UA: NEGATIVE
BILIRUBIN UA: NEGATIVE
Glucose, UA: NEGATIVE
LEUKOCYTES UA: NEGATIVE
Nitrite, UA: NEGATIVE
PH UA: 5.5
Protein Ur, POC: NEGATIVE
RBC UA: NEGATIVE
Spec Grav, UA: 1.01
Urobilinogen, UA: 0.2

## 2016-07-21 LAB — COMPREHENSIVE METABOLIC PANEL
ALBUMIN: 4.3 g/dL (ref 3.6–5.1)
ALK PHOS: 62 U/L (ref 33–115)
ALT: 14 U/L (ref 6–29)
AST: 18 U/L (ref 10–35)
BUN: 10 mg/dL (ref 7–25)
CALCIUM: 9.3 mg/dL (ref 8.6–10.2)
CO2: 24 mmol/L (ref 20–31)
Chloride: 104 mmol/L (ref 98–110)
Creat: 0.63 mg/dL (ref 0.50–1.10)
Glucose, Bld: 88 mg/dL (ref 65–99)
POTASSIUM: 4.2 mmol/L (ref 3.5–5.3)
Sodium: 138 mmol/L (ref 135–146)
TOTAL PROTEIN: 8 g/dL (ref 6.1–8.1)
Total Bilirubin: 0.5 mg/dL (ref 0.2–1.2)

## 2016-07-21 LAB — POC MICROSCOPIC URINALYSIS (UMFC): Mucus: ABSENT

## 2016-07-21 LAB — POCT GLYCOSYLATED HEMOGLOBIN (HGB A1C): Hemoglobin A1C: 6

## 2016-07-21 LAB — POCT URINE PREGNANCY: PREG TEST UR: NEGATIVE

## 2016-07-21 NOTE — Progress Notes (Signed)
MRN: [id number] DOB: 02/16/1971  Subjective:   Ariana Parsons is a 45 y.o. female presenting for chief complaint of Abdominal Pain  Reports 1 week history of lower abdominal pain. Pain is dull and achy, intermittent, associated with urinary frequency and nausea. Patient is sexually active, does not use condoms. Denies fever, vomiting, dysuria, hematuria, diarrhea, constipation, vaginal bleeding, vaginal discharge. Eats healthily, has bowel movements every day without straining or pain. No bloody stools. Hydrates very well. Denies smoking cigarettes or drinking alcohol.    Ariana Parsons has a current medication list which includes the following prescription(s): ondansetron. Also has No Known Allergies.  Ariana Parsons  has no past medical history on file. Also  has no past surgical history on file.   Objective:   Vitals: BP 128/86 (BP Location: Right Arm, Patient Position: Sitting, Cuff Size: Normal)   Pulse 63   Temp 97.9 F (36.6 C) (Oral)   Resp 16   Ht 5\' 1"  (1.549 m)   Wt 144 lb 4.8 oz (65.5 kg)   LMP 06/30/2016   SpO2 99%   BMI 27.27 kg/m   Physical Exam  Constitutional: She is oriented to person, place, and time. She appears well-developed and well-nourished.  HENT:  Mouth/Throat: Oropharynx is clear and moist.  Eyes: No scleral icterus.  Cardiovascular: Normal rate, regular rhythm and intact distal pulses.  Exam reveals no gallop and no friction rub.   No murmur heard. Pulmonary/Chest: No respiratory distress. She has no wheezes. She has no rales.  Abdominal: Soft. Bowel sounds are normal. She exhibits no distension and no mass. There is tenderness (lower, R>L). There is no guarding.  No CVA tenderness.  Neurological: She is alert and oriented to person, place, and time.  Skin: Skin is warm and dry.   Results for orders placed or performed in visit on 07/21/16 (from the past 24 hour(s))  POCT urinalysis dipstick     Status: None   Collection Time: 07/21/16  1:03 PM  Result Value Ref  Range   Color, UA yellow yellow   Clarity, UA clear clear   Glucose, UA negative negative   Bilirubin, UA negative negative   Ketones, POC UA negative negative   Spec Grav, UA 1.010    Blood, UA negative negative   pH, UA 5.5    Protein Ur, POC negative negative   Urobilinogen, UA 0.2    Nitrite, UA Negative Negative   Leukocytes, UA Negative Negative  POCT CBC     Status: Abnormal   Collection Time: 07/21/16  1:03 PM  Result Value Ref Range   WBC 9.1 4.6 - 10.2 K/uL   Lymph, poc 2.7 0.6 - 3.4   POC LYMPH PERCENT 29.5 10 - 50 %L   MID (cbc) 0.6 0 - 0.9   POC MID % 7.0 0 - 12 %M   POC Granulocyte 5.8 2 - 6.9   Granulocyte percent 63.5 37 - 80 %G   RBC 5.48 4.04 - 5.48 M/uL   Hemoglobin 15.4 12.2 - 16.2 g/dL   HCT, POC 40.943.7 81.137.7 - 47.9 %   MCV 19.8 (A) 80 - 97 fL   MCH, POC 28.2 27 - 31.2 pg   MCHC 35.3 31.8 - 35.4 g/dL   RDW, POC 91.413.8 %   Platelet Count, POC 247 142 - 424 K/uL   MPV 8.6 0 - 99.8 fL  POCT Microscopic Urinalysis (UMFC)     Status: None   Collection Time: 07/21/16  1:04 PM  Result Value  Ref Range   WBC,UR,HPF,POC None None WBC/hpf   RBC,UR,HPF,POC None None RBC/hpf   Bacteria None None, Too numerous to count   Mucus Absent Absent   Epithelial Cells, UR Per Microscopy None None, Too numerous to count cells/hpf  POCT glycosylated hemoglobin (Hb A1C)     Status: None   Collection Time: 07/21/16  1:04 PM  Result Value Ref Range   Hemoglobin A1C 6.0   POCT urine pregnancy     Status: None   Collection Time: 07/21/16  1:04 PM  Result Value Ref Range   Preg Test, Ur Negative Negative   Dg Abd 1 View  Result Date: 07/21/2016 CLINICAL DATA:  Abdominal pain . EXAM: ABDOMEN - 1 VIEW COMPARISON:  01/13/2003 report. FINDINGS: Soft tissue structures are unremarkable. No bowel distention. No pathologic intra-abdominal calcifications. No acute bony abnormality . IMPRESSION: No acute or focal abnormality. Electronically Signed   By: Maisie Fushomas  Register   On: 07/21/2016  13:25    Assessment and Plan :   1. Lower abdominal pain - POC labs equivocal. Patient has clear lower abdominal pain, R>L, without obvious source. Will pursue STAT CT abdomen and pelvis. Patient is in agreement.  Wallis BambergMario Azaryah Heathcock, PA-C [facility] [phone] 07/21/2016 12:08 PM

## 2016-07-21 NOTE — Patient Instructions (Addendum)
?au b?ng, Ng??i l?n (Abdominal Pain, Adult) C nhi?u nguyn nhn d?n ??n ?au b?ng. Thng th??ng ?au b?ng l do m?t b?nh gy ra v s? khng ?? n?u khng ?i?u tr?Marland Kitchen. B?nh ny c th? ???c theo di v ?i?u tr? t?i nh. Chuyn gia ch?m [place] s?c kh?e s? ti?n hnh khm th?c th? v c th? yu c?u lm xt nghi?m mu v ch?p X quang ?? xc ??nh m?c ?? nghim tr?ng c?a c?n ?au b?ng. Tuy nhin, trong nhi?u tr??ng h?p, ph?i m?t nhi?u th?i gian h?n ?? xc ??nh r nguyn nhn gy ?au b?ng. Tr??c khi tm ra nguyn nhn, chuyn gia ch?m [place] s?c kh?e c th? khng bi?t li?u qu v? c c?n lm thm xt nghi?m ho?c ti?p t?c ?i?u tr? hay khng. H??NG D?N CH?M [place] T?I NH  Theo di c?n ?au b?ng xem c b?t k? thay ??i no khng. Nh?ng hnh ??ng sau c th? gip lo?i b? b?t c? c?m gic kh ch?u no qu v? ?ang b?.  Ch? s? d?ng thu?c khng c?n k ??n ho?c thu?c c?n k ??n theo ch? d?n c?a chuyn gia ch?m [place] s?c kh?e.  Khng dng thu?c nhu?n trng tr? khi ???c chuyn gia ch?m [place] s?c kh?e ch? ??nh.  Th? dng m?t ch? ?? ?n l?ng (n??c lu?c th?t, tr, ho?c n??c) theo ch? d?n c?a chuyn gia ch?m [place] s?c kh?e. Chuy?n d?n sang m?t ch? ?? ?n nh? n?u ch?u ???c. ?I KHM N?U:  Qu v? b? ?au b?ng khng r nguyn nhn.  Qu v? b? ?au b?ng km theo bu?n nn ho?c tiu ch?y.  Qu v? b? ?au khi ?i ti?u ho?c ?i ngoi.  Qu v? b? c?n ?au b?ng lm th?c gi?c vo ban ?m.  Qu v? b? ?au b?ng n?ng thm ho?c ?? h?n khi ?n.  Qu v? b? ?au b?ng n?ng thm khi ?n ?? ?n nhi?u ch?t bo.  Qu v? b? s?t. NGAY L?P T?C ?I KHM N?U:   C?n ?au khng kh?i trong vng 2 gi?Ladell Heads.  Qu v? v?n ti?p t?c b? i (nn m?a).  Ch? c th? c?m th?y ?au ? m?t s? ph?n b?ng, ch?ng h?n nh? ? ph?n b?ng bn ph?i ho?c bn d??i tri.  Phn c?a qu v? c mu ho?c c mu ?en nh? h?c n. ??M B?O QU V?:  Hi?u r cc h??ng d?n ny.  S? theo di tnh tr?ng c?a mnh.  S? yu c?u tr? gip ngay l?p t?c n?u qu v? c?m th?y khng kh?e ho?c th?y tr?m tr?ng h?n.   Thng tin  ny khng nh?m m?c ?ch thay th? cho l?i khuyn m chuyn gia ch?m [place] s?c kh?e ni v?i qu v?. Hy b?o ??m qu v? ph?i th?o lu?n b?t k? v?n ?? g m qu v? c v?i chuyn gia ch?m [place] s?c kh?e c?a qu v?.   Document Released: 08/28/2005 Document Revised: 09/18/2014 Else[employer] Interactive Patient Education [employer]2016 [employer]     IF you received an x-ray today, you will receive an invoice from [facility]. Please contact [facility] at [phone] with questions or concerns regarding your invoice.   IF you received labwork today, you will receive an invoice from [facility]. Please contact [facility] at [phone] with questions or concerns regarding your invoice.   Our billing staff will not be able to assist you with questions regarding bills from these companies.  You will be contacted with the lab results as soon as they are available. The  fastest way to get your results is to activate your My Chart account. Instructions are located on the last page of this paperwork. If you have not heard from us regarding the results in 2 weeks, please contact this office.

## 2018-11-26 ENCOUNTER — Emergency Department (HOSPITAL_COMMUNITY)
Admission: EM | Admit: 2018-11-26 | Discharge: 2018-11-26 | Disposition: A | Discharge status: Home / Self Care | Payer: No Typology Code available for payment source | Attending: Emergency Medicine | Admitting: Emergency Medicine

## 2018-11-26 ENCOUNTER — Other Ambulatory Visit: Payer: Self-pay

## 2018-11-26 DIAGNOSIS — J069 Acute upper respiratory infection, unspecified: Secondary | ICD-10-CM | POA: Diagnosis not present

## 2018-11-26 DIAGNOSIS — R51 Headache: Secondary | ICD-10-CM | POA: Diagnosis present

## 2018-11-26 MED ORDER — ACETAMINOPHEN 325 MG PO TABS
650.0000 mg | ORAL_TABLET | Freq: Once | ORAL | Status: AC
  Administered 2018-11-26: 650 mg via ORAL
  Filled 2018-11-26: qty 2

## 2018-11-26 NOTE — ED Provider Notes (Signed)
[facility] EMERGENCY DEPARTMENT Provider Note   CSN: [id number] Arrival date & time: 11/26/18  1943    History   Chief Complaint Chief Complaint  Patient presents with  . Fever  . Headache    HPI Ariana Parsons is a 48 y.o. female.     48 year old female presents with complaint of headache, body aches, feeling flushed in the face and a mild nonproductive cough.  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  Patient denies recent travel, sick contacts, exposure to anyone who has traveled, shortness of breath or difficulty breathing.  Patient has not taken any medications, has not checked her temperature.  Presents to the ER today with concerns for coronavirus which she has heard about in the news.  No other complaints or concerns.     No past medical history on file.  There are no active problems to display for this patient.  OB History   No obstetric history on file.      Home Medications    Prior to Admission medications   Not on File    Family History No family history on file.  Social History Social History   Tobacco Use  . Smoking status: Never Smoker  Substance Use Topics  . Alcohol use: Not on file  . Drug use: Not on file     Allergies   Patient has no known allergies.   Review of Systems Review of Systems  Constitutional: Negative for fever.  HENT: Negative for congestion, sinus pressure, sinus pain, sneezing and sore throat.   Respiratory: Positive for cough. Negative for shortness of breath and wheezing.   Cardiovascular: Negative for chest pain.  Musculoskeletal: Positive for myalgias.  Skin: Negative for rash.  Allergic/Immunologic: Negative for immunocompromised state.  Neurological: Positive for headaches.  Hematological: Negative for adenopathy.  Psychiatric/Behavioral: Negative for confusion.  All other systems reviewed and are negative.    Physical  Exam Updated Vital Signs BP (!) 149/84 (BP Location: Right Arm)   Pulse 92   Temp 98.8 F (37.1 C) (Oral)   Resp 18   Ht 5' (1.524 m)   Wt 65.8 kg   SpO2 98%   BMI 28.32 kg/m   Physical Exam Vitals signs and nursing note reviewed.  Constitutional:      General: She is not in acute distress.    Appearance: Normal appearance. She is well-developed. She is not diaphoretic.  HENT:     Head: Normocephalic and atraumatic.     Right Ear: Tympanic membrane and ear canal normal.     Left Ear: Tympanic membrane and ear canal normal.     Nose: Nose normal. No congestion.     Mouth/Throat:     Mouth: Mucous membranes are moist.     Pharynx: No oropharyngeal exudate or posterior oropharyngeal erythema.  Neck:     Musculoskeletal: Neck supple.  Cardiovascular:     Rate and Rhythm: Normal rate and regular rhythm.     Pulses: Normal pulses.     Heart sounds: Normal heart sounds.  Pulmonary:     Effort: Pulmonary effort is normal.     Breath sounds: Normal breath sounds.  Skin:    General: Skin is warm and dry.     Findings: No erythema or rash.  Neurological:     Mental Status: She is alert and oriented to person, place, and time.  Psychiatric:  Behavior: Behavior normal.      ED Treatments / Results  Labs (all labs ordered are listed, but only abnormal results are displayed) Labs Reviewed - No data to display  EKG None  Radiology No results found.  Procedures Procedures (including critical care time)  Medications Ordered in ED Medications  acetaminophen (TYLENOL) tablet 650 mg (has no administration in time range)     Initial Impression / Assessment and Plan / ED Course  I have reviewed the triage vital signs and the nursing notes.  Pertinent labs & imaging results that were available during my care of the patient were reviewed by me and considered in my medical decision making (see chart for details).  Clinical Course as of Nov 26 2019  Tue Nov 26, 2018   3370 48 year old female with no significant past medical history presents with complaint of headache, body aches, mild nonproductive cough and feeling flushed in the face.  Patient symptoms started 7 hours ago.  No risk factors for ccovid19, does not meet current testing criteria.  Gust with patient possible flu versus other viral illness.  She has not take anything for fever, has not had documented fever at home and arrives in the ER afebrile.  Patient's blood pressure is very mildly elevated on recheck.  Recommend home to rest, Tylenol for her headaches and body aches.  Return to ER for severe concerning symptoms.  Patient feels reassured after discussion and agreeable with plan for discharge home.   [LM]    Clinical Course User Index [LM] Jeannie Fend, PA-C   Final Clinical Impressions(s) / ED Diagnoses   Final diagnoses:  Viral upper respiratory tract infection    ED Discharge Orders    None       Jeannie Fend, PA-C 11/26/18 2021    Margarita Grizzle, MD 11/29/18 1759

## 2018-11-26 NOTE — ED Notes (Signed)
Patient verbalizes understanding of discharge instructions . Opportunity for questions and answers were provided . Armband removed by staff ,Pt discharged from ED. W/C  offered at D/C  and Declined W/C at D/C and was escorted to lobby by RN.  

## 2018-11-26 NOTE — ED Triage Notes (Signed)
Patient presents to the ED with C/O fever, cough and headache. Onset was today after work.

## 2018-11-26 NOTE — Discharge Instructions (Addendum)
Home to rest. Take Tylenol as directed for headaches. Monitor your fever. Take cold medication as needed for your cough or other symptoms. Turn to the ER for difficulty breathing or severe symptoms.

## 2019-05-02 ENCOUNTER — Other Ambulatory Visit: Payer: Self-pay

## 2019-05-02 DIAGNOSIS — Z20822 Contact with and (suspected) exposure to covid-19: Secondary | ICD-10-CM

## 2019-05-03 LAB — NOVEL CORONAVIRUS, NAA: SARS-CoV-2, NAA: NOT DETECTED

## 2021-12-04 ENCOUNTER — Encounter (HOSPITAL_COMMUNITY): Payer: Self-pay

## 2021-12-04 ENCOUNTER — Emergency Department (HOSPITAL_COMMUNITY)
Admission: EM | Admit: 2021-12-04 | Discharge: 2021-12-05 | Disposition: A | Discharge status: Home / Self Care | Payer: No Typology Code available for payment source | Attending: Emergency Medicine | Admitting: Emergency Medicine

## 2021-12-04 ENCOUNTER — Other Ambulatory Visit: Payer: Self-pay

## 2021-12-04 ENCOUNTER — Emergency Department (HOSPITAL_COMMUNITY): Payer: No Typology Code available for payment source

## 2021-12-04 DIAGNOSIS — R1010 Upper abdominal pain, unspecified: Secondary | ICD-10-CM | POA: Diagnosis not present

## 2021-12-04 DIAGNOSIS — Z20822 Contact with and (suspected) exposure to covid-19: Secondary | ICD-10-CM | POA: Diagnosis not present

## 2021-12-04 DIAGNOSIS — R0602 Shortness of breath: Secondary | ICD-10-CM | POA: Insufficient documentation

## 2021-12-04 DIAGNOSIS — M546 Pain in thoracic spine: Secondary | ICD-10-CM | POA: Diagnosis not present

## 2021-12-04 DIAGNOSIS — R079 Chest pain, unspecified: Secondary | ICD-10-CM | POA: Diagnosis not present

## 2021-12-04 HISTORY — DX: Essential (primary) hypertension: I10

## 2021-12-04 LAB — COMPREHENSIVE METABOLIC PANEL
ALT: 17 U/L (ref 0–44)
AST: 22 U/L (ref 15–41)
Albumin: 4 g/dL (ref 3.5–5.0)
Alkaline Phosphatase: 65 U/L (ref 38–126)
Anion gap: 6 (ref 5–15)
BUN: 9 mg/dL (ref 6–20)
CO2: 27 mmol/L (ref 22–32)
Calcium: 8.8 mg/dL — ABNORMAL LOW (ref 8.9–10.3)
Chloride: 104 mmol/L (ref 98–111)
Creatinine, Ser: 0.57 mg/dL (ref 0.44–1.00)
GFR, Estimated: >60 mL/min (ref >60)
Glucose, Bld: 158 mg/dL — ABNORMAL HIGH (ref 70–99)
Potassium: 3.4 mmol/L — ABNORMAL LOW (ref 3.5–5.1)
Sodium: 137 mmol/L (ref 135–145)
Total Bilirubin: 0.5 mg/dL (ref 0.3–1.2)
Total Protein: 8.1 g/dL (ref 6.5–8.1)

## 2021-12-04 LAB — CBC WITH DIFFERENTIAL/PLATELET
Abs Immature Granulocytes: 0.1 10*3/uL — ABNORMAL HIGH (ref 0.00–0.07)
Basophils Absolute: 0.1 10*3/uL (ref 0.0–0.1)
Basophils Relative: 1 %
Eosinophils Absolute: 0.5 10*3/uL (ref 0.0–0.5)
Eosinophils Relative: 4 %
HCT: 43.9 % (ref 36.0–46.0)
Hemoglobin: 14.2 g/dL (ref 12.0–15.0)
Immature Granulocytes: 1 %
Lymphocytes Relative: 21 %
Lymphs Abs: 3 10*3/uL (ref 0.7–4.0)
MCH: 27.2 pg (ref 26.0–34.0)
MCHC: 32.3 g/dL (ref 30.0–36.0)
MCV: 83.9 fL (ref 80.0–100.0)
Monocytes Absolute: 1 10*3/uL (ref 0.1–1.0)
Monocytes Relative: 7 %
Neutro Abs: 10 10*3/uL — ABNORMAL HIGH (ref 1.7–7.7)
Neutrophils Relative %: 66 %
Platelets: 341 10*3/uL (ref 150–400)
RBC: 5.23 MIL/uL — ABNORMAL HIGH (ref 3.87–5.11)
RDW: 13.3 % (ref 11.5–15.5)
WBC: 14.7 10*3/uL — ABNORMAL HIGH (ref 4.0–10.5)
nRBC: 0 % (ref 0.0–0.2)

## 2021-12-04 LAB — RESP PANEL BY RT-PCR (FLU A&B, COVID) ARPGX2
Influenza A by PCR: NEGATIVE
Influenza B by PCR: NEGATIVE
SARS Coronavirus 2 by RT PCR: NEGATIVE

## 2021-12-04 LAB — LIPASE, BLOOD: Lipase: 33 U/L (ref 11–51)

## 2021-12-04 LAB — D-DIMER, QUANTITATIVE: D-Dimer, Quant: 0.38 ug/mL-FEU (ref 0.00–0.50)

## 2021-12-04 LAB — TROPONIN I (HIGH SENSITIVITY): Troponin I (High Sensitivity): 4 ng/L (ref <18)

## 2021-12-04 LAB — BRAIN NATRIURETIC PEPTIDE: B Natriuretic Peptide: 50.9 pg/mL (ref 0.0–100.0)

## 2021-12-04 MED ORDER — ONDANSETRON 4 MG PO TBDP
4.0000 mg | ORAL_TABLET | Freq: Once | ORAL | Status: AC
  Administered 2021-12-04: 4 mg via ORAL
  Filled 2021-12-04: qty 1

## 2021-12-04 MED ORDER — IOHEXOL 350 MG/ML SOLN
100.0000 mL | Freq: Once | INTRAVENOUS | Status: AC | PRN
  Administered 2021-12-04: 100 mL via INTRAVENOUS

## 2021-12-04 NOTE — ED Provider Notes (Signed)
?[facility] DEPT ?Provider Note ? ? ?CSN: [id number] ?Arrival date & time: 12/04/21  2028 ? ?  ? ?History ?15 y/ ?Chief Complaint  ?Patient presents with  ? Shortness of Breath  ? ? ?Ariana Parsons is a 51 y.o. female who presents emergency department chief complaint of chest pain and shortness of breath.  The patient speaks Rade and we do not have available language translation for this specific pain in his dialect and her husband provides translation.  He states that earlier taking care of their grandchild.  His wife went out on a walk with the grandchild.  He states that when she came back she laid down on the couch and began complaining that she felt rarely bad.  She was complaining of pain in her chest when she took a deep breath.  He states that she was breathing quickly and Stating that she felt like she was having trouble breathing.  He states that he gave her some cold water and tried to massage her and helped her lay down in the bed.  He said just try to go to sleep however she was unable to due to the pain.  This they came to the hospital for further evaluation.  She complains of pain and points to her sternal region.  She also has pain in her upper abdomen and pain in her right upper back.  She denies any vomiting but had some mild nausea earlier.  She denies diarrhea, fevers, chills.  She has no unilateral leg swelling and does not take any medications except for antihypertensives.  The patient denies any paresthesia or weakness in the upper extremities. ? ? ?Shortness of Breath ? ?  ? ?Home Medications ?Prior to Admission medications   ?Not on File  ?   ? ?Allergies    ?Patient has no known allergies.   ? ?Review of Systems   ?Review of Systems  ?Respiratory:  Positive for shortness of breath.   ? ?Physical Exam ?Updated Vital Signs ?BP (!) 168/96   Pulse 71   Temp 98.3 ?F (36.8 ?C) (Oral)   Resp (!) 28   Ht 5' (1.524 m)   Wt 65.8 kg   LMP 11/20/2021   SpO2 98%   BMI  28.32 kg/m?  ?Physical Exam ? ?ED Results / Procedures / Treatments   ?Labs ?(all labs ordered are listed, but only abnormal results are displayed) ?Labs Reviewed  ?CBC WITH DIFFERENTIAL/PLATELET - Abnormal; Notable for the following components:  ?    Result Value  ? WBC 14.7 (*)   ? RBC 5.23 (*)   ? Neutro Abs 10.0 (*)   ? Abs Immature Granulocytes 0.10 (*)   ? All other components within normal limits  ?RESP PANEL BY RT-PCR (FLU A&B, COVID) ARPGX2  ?D-DIMER, QUANTITATIVE  ?COMPREHENSIVE METABOLIC PANEL  ?LIPASE, BLOOD  ?BRAIN NATRIURETIC PEPTIDE  ?TROPONIN I (HIGH SENSITIVITY)  ? ? ?EKG ?EKG Interpretation ? ?Date/Time:  Sunday December 04 2021 21:29:48 EDT ?Ventricular Rate:  72 ?PR Interval:  155 ?QRS Duration: 77 ?QT Interval:  387 ?QTC Calculation: 424 ?R Axis:   33 ?Text Interpretation: Sinus rhythm Confirmed by Lennice Sites [phone]) on 12/04/2021 9:34:37 PM ? ?Radiology ?DG Chest 2 View ? ?Result Date: 12/04/2021 ?CLINICAL DATA:  Shortness of breath and upper abdominal pain. EXAM: CHEST - 2 VIEW COMPARISON:  None. FINDINGS: The heart size and mediastinal contours are within normal limits. Low lung volumes are seen with subsequent bibasilar crowding of the bronchovascular lung  markings. Both lungs are otherwise clear. The visualized skeletal structures are unremarkable. IMPRESSION: Low lung volumes without acute or active cardiopulmonary disease. Electronically Signed   By: Virgina Norfolk M.D.   On: 12/04/2021 21:20   ? ?Procedures ?Procedures  ? ? ?Medications Ordered in ED ?Medications - No data to display ? ?ED Course/ Medical Decision Making/ A&P ?Clinical Course as of 12/05/21 0017  ?Sun Dec 04, 2021  ?2346 CBC with Differential(!) [AH]  ?2346 WBC(!): 14.7 [AH]  ?2347 Comprehensive metabolic panel(!) [AH]  ?2347 Resp Panel by RT-PCR (Flu A&B, Covid) Nasopharyngeal Swab [AH]  ?2347 Lipase, blood [AH]  ?2347 B Natriuretic Peptide: 50.9 [AH]  ?2347 Troponin I (High Sensitivity) [AH]  ?2347 Brain natriuretic  peptide [AH]  ?2347 D-dimer, quantitative [AH]  ?2347 DG Chest 2 View [AH]  ?2347 EKG 12-Lead [AH]  ?  ?Clinical Course User Index ?[AH] Margarita Mail, PA-C  ? ?                        ?Medical Decision Making ?51 y/o f here with chest pain. ?The emergent differential diagnosis of chest pain includes: Acute coronary syndrome, pericarditis, aortic dissection, pulmonary embolism, tension pneumothorax, pneumonia, and esophageal rupture. ?Initial work up is reassuring. No evidence of ACS. ?Patient CTA pending. Sign out given to PA Henderly at shift change. ? ? ? ?Amount and/or Complexity of Data Reviewed ?Labs: ordered. Decision-making details documented in ED Course. ?Radiology: ordered. Decision-making details documented in ED Course. ?ECG/medicine tests:  Decision-making details documented in ED Course. ? ?Risk ?Prescription drug management. ? ? ?Final Clinical Impression(s) / ED Diagnoses ?Final diagnoses:  ?None  ? ? ?Rx / DC Orders ?ED Discharge Orders   ? ? None  ? ?  ? ? ?  ?Margarita Mail, PA-C ?12/06/21 2319 ? ?  ?Lennice Sites, DO ?12/07/21 [id number] ? ?

## 2021-12-04 NOTE — ED Notes (Signed)
Patient transported to CT 

## 2021-12-04 NOTE — ED Triage Notes (Signed)
Patient arrived with complaints of pain from upper abdomen around to her back and shortness of breath that started when laying down.  ?

## 2021-12-04 NOTE — ED Provider Notes (Signed)
Care assumed from previous provider. See note for full HPI. ? ?Here for upper abd pain, sob ? ?Labs neg, FU on trop and CT dissection ? ?If neg can dc home with pain control ?Physical Exam  ?BP 127/74   Pulse 76   Temp 98.3 ?F (36.8 ?C) (Oral)   Resp (!) 22   Ht 5' (1.524 m)   Wt 65.8 kg   LMP 11/20/2021   SpO2 99%   BMI 28.32 kg/m?  ? ?Physical Exam ?Vitals and nursing note reviewed.  ?Constitutional:   ?   General: She is not in acute distress. ?   Appearance: She is well-developed. She is not ill-appearing.  ?HENT:  ?   Head: Atraumatic.  ?   Mouth/Throat:  ?   Mouth: Mucous membranes are moist.  ?Eyes:  ?   Pupils: Pupils are equal, round, and reactive to light.  ?Cardiovascular:  ?   Rate and Rhythm: Normal rate.  ?Pulmonary:  ?   Effort: Pulmonary effort is normal. No respiratory distress.  ?   Breath sounds: Normal breath sounds.  ?Chest:  ?   Comments: Non tender ?Abdominal:  ?   General: Bowel sounds are normal. There is no distension.  ?   Palpations: Abdomen is soft. There is no mass.  ?   Tenderness: There is no abdominal tenderness. There is no guarding or rebound.  ?   Comments: Soft, non tender, negative Murphy sign  ?Musculoskeletal:     ?   General: Normal range of motion.  ?   Cervical back: Normal range of motion.  ?   Right lower leg: No tenderness. No edema.  ?   Left lower leg: No tenderness. No edema.  ?Skin: ?   General: Skin is warm and dry.  ?   Capillary Refill: Capillary refill takes less than 2 seconds.  ?Neurological:  ?   General: No focal deficit present.  ?   Mental Status: She is alert.  ?Psychiatric:     ?   Mood and Affect: Mood normal.  ? ?Procedures  ?Procedures ?Labs Reviewed  ?CBC WITH DIFFERENTIAL/PLATELET - Abnormal; Notable for the following components:  ?    Result Value  ? WBC 14.7 (*)   ? RBC 5.23 (*)   ? Neutro Abs 10.0 (*)   ? Abs Immature Granulocytes 0.10 (*)   ? All other components within normal limits  ?COMPREHENSIVE METABOLIC PANEL - Abnormal; Notable for  the following components:  ? Potassium 3.4 (*)   ? Glucose, Bld 158 (*)   ? Calcium 8.8 (*)   ? All other components within normal limits  ?RESP PANEL BY RT-PCR (FLU A&B, COVID) ARPGX2  ?LIPASE, BLOOD  ?BRAIN NATRIURETIC PEPTIDE  ?D-DIMER, QUANTITATIVE  ?TROPONIN I (HIGH SENSITIVITY)  ?TROPONIN I (HIGH SENSITIVITY)  ? DG Chest 2 View ? ?Result Date: 12/04/2021 ?CLINICAL DATA:  Shortness of breath and upper abdominal pain. EXAM: CHEST - 2 VIEW COMPARISON:  None. FINDINGS: The heart size and mediastinal contours are within normal limits. Low lung volumes are seen with subsequent bibasilar crowding of the bronchovascular lung markings. Both lungs are otherwise clear. The visualized skeletal structures are unremarkable. IMPRESSION: Low lung volumes without acute or active cardiopulmonary disease. Electronically Signed   By: Virgina Norfolk M.D.   On: 12/04/2021 21:20  ? ?CT Angio Chest/Abd/Pel for Dissection W and/or Wo Contrast ? ?Result Date: 12/05/2021 ?CLINICAL DATA:  Chest pain and back pain. EXAM: CT ANGIOGRAPHY CHEST, ABDOMEN AND PELVIS TECHNIQUE: Non-contrast  CT of the chest was initially obtained. Multidetector CT imaging through the chest, abdomen and pelvis was performed using the standard protocol during bolus administration of intravenous contrast. Multiplanar reconstructed images and MIPs were obtained and reviewed to evaluate the vascular anatomy. RADIATION DOSE REDUCTION: This exam was performed according to the departmental dose-optimization program which includes automated exposure control, adjustment of the mA and/or kV according to patient size and/or use of iterative reconstruction technique. CONTRAST:  186mL OMNIPAQUE IOHEXOL 350 MG/ML SOLN COMPARISON:  July 21, 2016 FINDINGS: CTA CHEST FINDINGS Cardiovascular: There is no evidence of thoracic aortic aneurysm or dissection. Satisfactory opacification of the pulmonary arteries to the segmental level. No evidence of pulmonary embolism. Normal  heart size. No pericardial effusion. Mediastinum/Nodes: No enlarged mediastinal, hilar, or axillary lymph nodes. Thyroid gland, trachea, and esophagus demonstrate no significant findings. Lungs/Pleura: Very mild atelectasis is seen along the posterior aspect of the right lung base. A very small right pleural effusion is also seen within this region. No pneumothorax is identified. Musculoskeletal: No chest wall abnormality. No acute or significant osseous findings. Review of the MIP images confirms the above findings. CTA ABDOMEN AND PELVIS FINDINGS VASCULAR Aorta: Normal caliber aorta without aneurysm, dissection, vasculitis or significant stenosis. Celiac: Patent without evidence of aneurysm, dissection, vasculitis or significant stenosis. SMA: Patent without evidence of aneurysm, dissection, vasculitis or significant stenosis. Renals: Both renal arteries are patent without evidence of aneurysm, dissection, vasculitis, fibromuscular dysplasia or significant stenosis. IMA: Patent without evidence of aneurysm, dissection, vasculitis or significant stenosis. Inflow: Patent without evidence of aneurysm, dissection, vasculitis or significant stenosis. Veins: No obvious venous abnormality within the limitations of this arterial phase study. Review of the MIP images confirms the above findings. NON-VASCULAR Hepatobiliary: No focal liver abnormality is seen. No gallstones, gallbladder wall thickening, or biliary dilatation. Pancreas: Unremarkable. No pancreatic ductal dilatation or surrounding inflammatory changes. Spleen: Normal in size without focal abnormality. Adrenals/Urinary Tract: Adrenal glands are unremarkable. Kidneys are normal, without renal calculi, focal lesion, or hydronephrosis. The urinary bladder is poorly distended and subsequently limited in evaluation. Stomach/Bowel: Stomach is within normal limits. Appendix appears normal. No evidence of bowel wall thickening, distention, or inflammatory changes.  Lymphatic: No abnormal abdominal or pelvic lymph nodes are identified. Reproductive: Uterus and bilateral adnexa are unremarkable. Other: No abdominal wall hernia or abnormality. No abdominopelvic ascites. Musculoskeletal: No acute or significant osseous findings. Review of the MIP images confirms the above findings. IMPRESSION: 1. No evidence of pulmonary embolism. 2. Very small right pleural effusion with mild right basilar atelectasis. 3. No evidence of aneurysmal dilatation, dissection or major vessel occlusion within the arterial structures of the chest, abdomen and pelvis. Electronically Signed   By: Virgina Norfolk M.D.   On: 12/05/2021 00:30    ?ED Course / MDM  ? ?Clinical Course as of 12/05/21 0156  ?Sun Dec 04, 2021  ?2346 CBC with Differential(!) [AH]  ?2346 WBC(!): 14.7 [AH]  ?2347 Comprehensive metabolic panel(!) [AH]  ?2347 Resp Panel by RT-PCR (Flu A&B, Covid) Nasopharyngeal Swab [AH]  ?2347 Lipase, blood [AH]  ?2347 B Natriuretic Peptide: 50.9 [AH]  ?2347 Troponin I (High Sensitivity) [AH]  ?2347 Brain natriuretic peptide [AH]  ?2347 D-dimer, quantitative [AH]  ?2347 DG Chest 2 View [AH]  ?2347 EKG 12-Lead [AH]  ?  ?Clinical Course User Index ?[AH] Margarita Mail, PA-C  ? ?Medical Decision Making ?Amount and/or Complexity of Data Reviewed ?Independent Historian: spouse ?Labs: ordered. Decision-making details documented in ED Course. ?Radiology: ordered and independent interpretation  performed. Decision-making details documented in ED Course. ?ECG/medicine tests: ordered and independent interpretation performed. Decision-making details documented in ED Course. ? ?Risk ?OTC drugs. ?Prescription drug management. ?Parenteral controlled substances. ?Diagnosis or treatment significantly limited by social determinants of health. ? ? ?FU on repeat trop and CT scan ? ?CT scan shows very small right pleural effusion.  No pneumonia, pneumothorax, large PE, dissection, gallstones.  Heart and lungs clear.   Abdomen soft, nontender.  She has negative Murphy sign. NV intact. ? ?Patient assessed.  She is ambulatory without any difficulty.  She denies any current pain.  I discussed labs and imaging with patient husband in r

## 2021-12-05 ENCOUNTER — Emergency Department (HOSPITAL_COMMUNITY): Payer: No Typology Code available for payment source

## 2021-12-05 LAB — TROPONIN I (HIGH SENSITIVITY): Troponin I (High Sensitivity): 4 ng/L (ref <18)

## 2021-12-05 MED ORDER — ONDANSETRON 4 MG PO TBDP: 4.0000 mg | ORAL_TABLET | Freq: Three times a day (TID) | ORAL | 0 refills | Status: AC | PRN

## 2021-12-05 MED ORDER — NAPROXEN 500 MG PO TABS: 500.0000 mg | ORAL_TABLET | Freq: Two times a day (BID) | ORAL | 0 refills | Status: AC

## 2021-12-05 NOTE — Discharge Instructions (Addendum)
Follow up with primary care provider.  Return for new or worsening symptoms. 

## 2021-12-05 NOTE — ED Notes (Addendum)
Discharge instructions reviewed with family member at bedside, as pt's native language is not available via interpreter. Questions answered. Family encouraged to call PCP tomorrow and try to get in sooner than next scheduled appt in May. Family states no further questions. Family encouraged to tell patient to return if symptoms worsen. ?

## 2023-04-08 IMAGING — CT CT ANGIO CHEST-ABD-PELV FOR DISSECTION W/ AND WO/W CM
2 of 7 series · 14 of 46 positions shown, 16 images · non-contrast
Comparison: July 21, 2016

CLINICAL DATA: Chest pain and back pain.

EXAM:
CT ANGIOGRAPHY CHEST, ABDOMEN AND PELVIS
TECHNIQUE: Non-contrast CT of the chest was initially obtained.

[Series 6: axial arterial · axial · arterial · 0.73mm/px · z∈[-562,-58]mm · 11 of 192 slices shown, 13 images]
[im 12/192  soft-tissue]
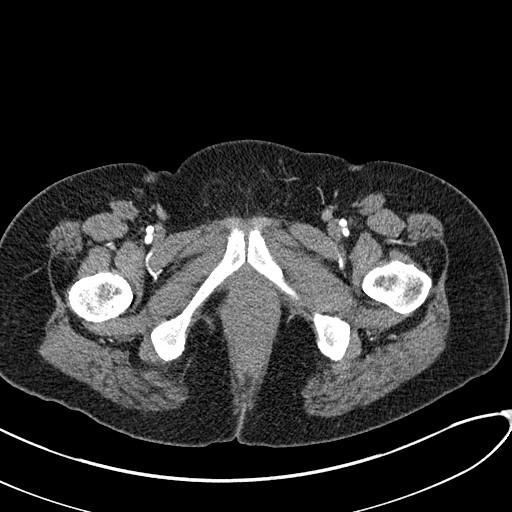
[im 12/192  bone]
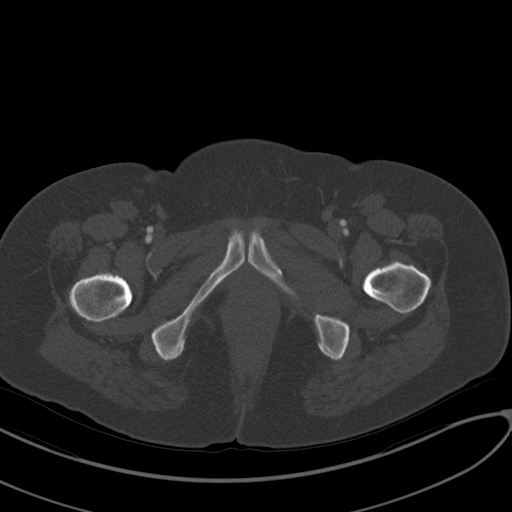
[im 36/192  soft-tissue]
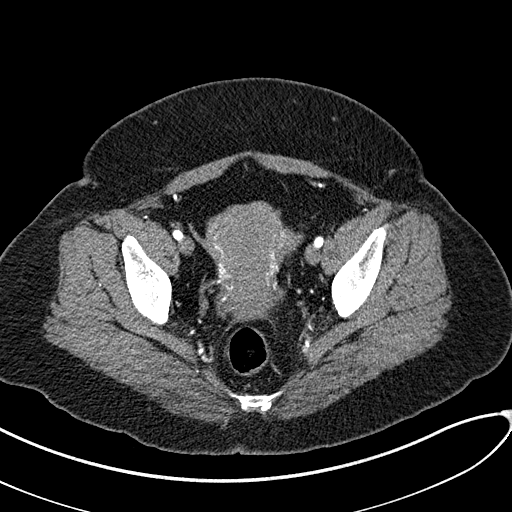
[im 48/192  soft-tissue]
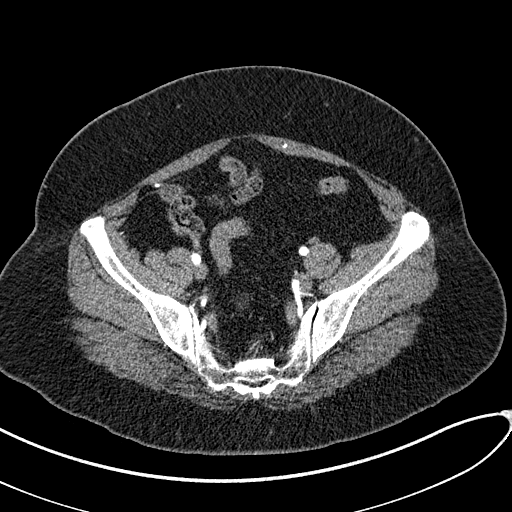
[im 60/192  soft-tissue]
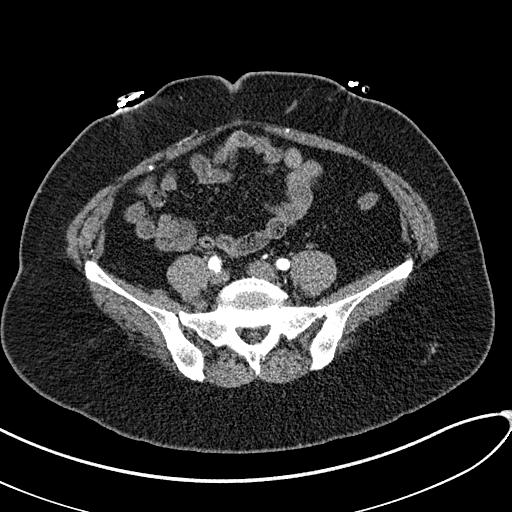
[im 84/192  soft-tissue]
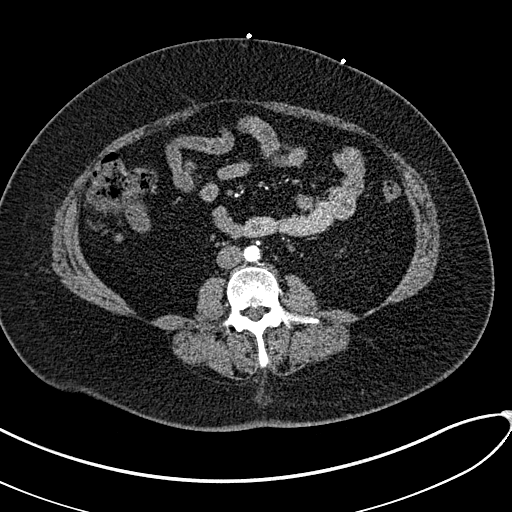
[im 96/192  soft-tissue]
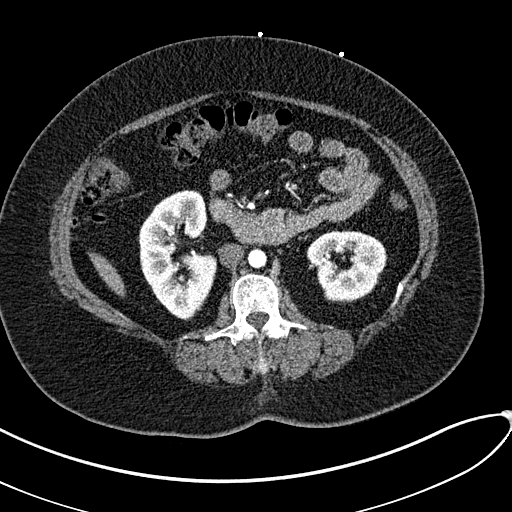
[im 108/192  soft-tissue]
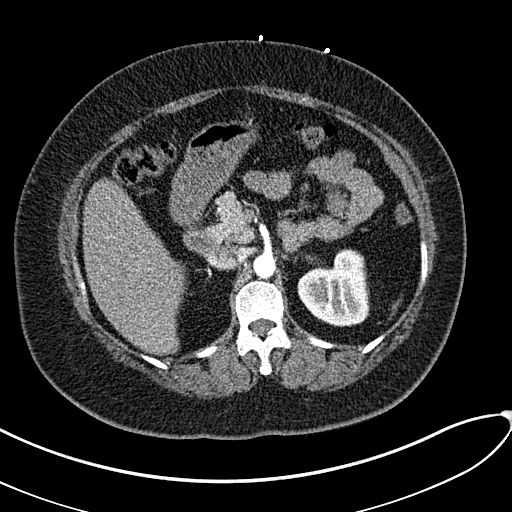
[im 132/192  soft-tissue]
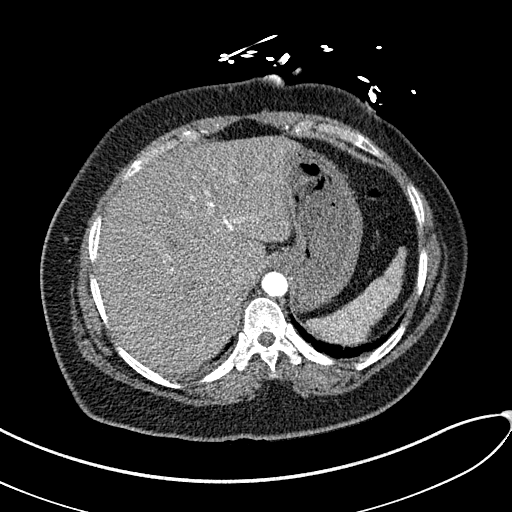
[im 144/192  soft-tissue]
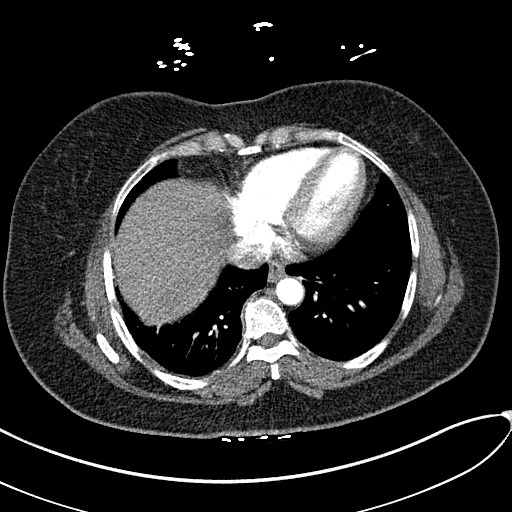
[im 144/192  bone]
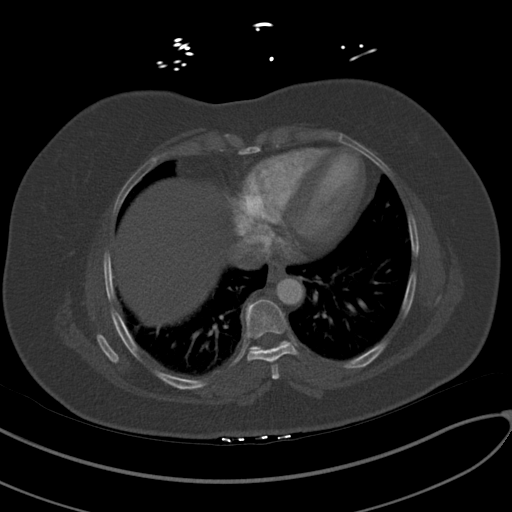
[im 156/192  soft-tissue]
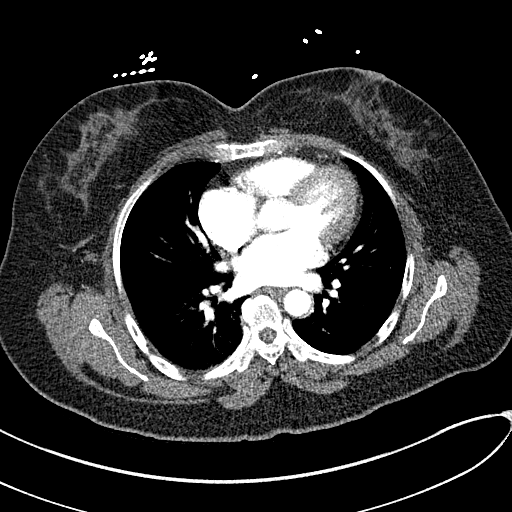
[im 180/192  soft-tissue]
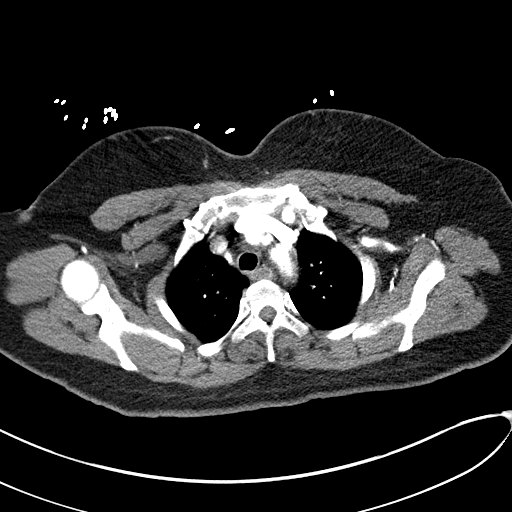

[Series 9: coronals · coronal · 0.78mm/px · 3 of 168 slices shown]
[im 42/168  soft-tissue]
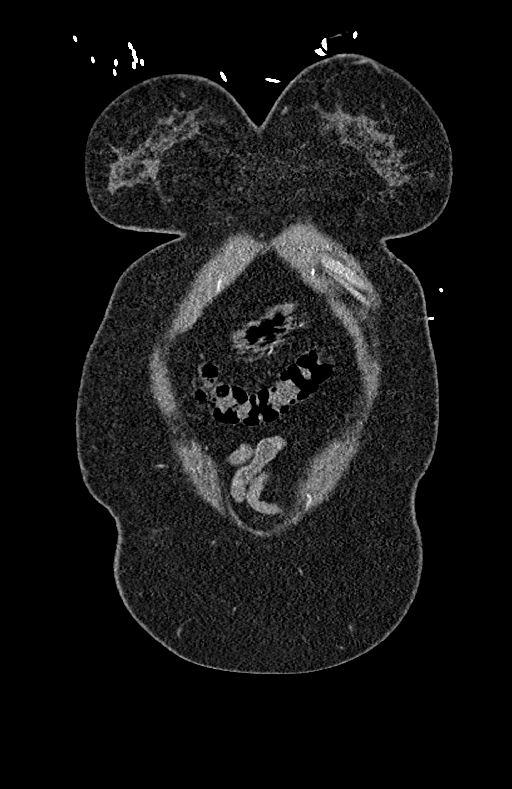
[im 84/168  soft-tissue]
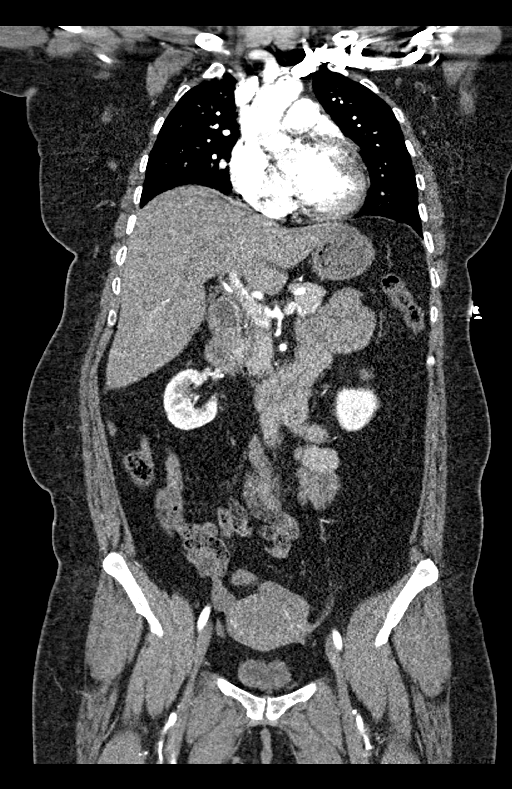
[im 126/168  soft-tissue]
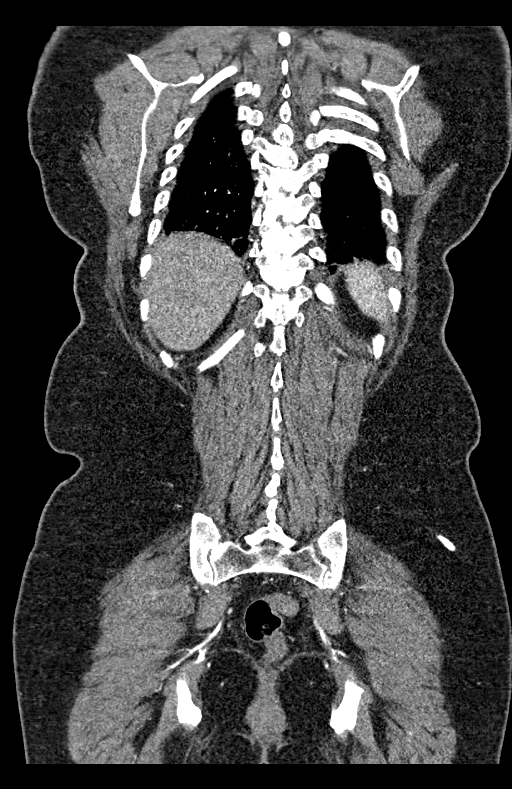

[14 of 46 positions shown; findings below may reference images not displayed]

Multidetector CT imaging through the chest, abdomen and pelvis was
performed using the standard protocol during bolus administration of
intravenous contrast. Multiplanar reconstructed images and MIPs were
obtained and reviewed to evaluate the vascular anatomy.

RADIATION DOSE REDUCTION: This exam was performed according to the
departmental dose-optimization program which includes automated
exposure control, adjustment of the mA and/or kV according to
patient size and/or use of iterative reconstruction technique.

CONTRAST:  100mL OMNIPAQUE IOHEXOL 350 MG/ML SOLN
FINDINGS: CTA CHEST FINDINGS

Cardiovascular: There is no evidence of thoracic aortic aneurysm or
dissection. Satisfactory opacification of the pulmonary arteries to
the segmental level. No evidence of pulmonary embolism. Normal heart
size. No pericardial effusion.

Mediastinum/Nodes: No enlarged mediastinal, hilar, or axillary lymph
nodes. Thyroid gland, trachea, and esophagus demonstrate no
significant findings.

Lungs/Pleura: Very mild atelectasis is seen along the posterior
aspect of the right lung base.

A very small right pleural effusion is also seen within this region.

No pneumothorax is identified.

Musculoskeletal: No chest wall abnormality. No acute or significant
osseous findings.

Review of the MIP images confirms the above findings.

CTA ABDOMEN AND PELVIS FINDINGS

VASCULAR

Aorta: Normal caliber aorta without aneurysm, dissection, vasculitis
or significant stenosis.

Celiac: Patent without evidence of aneurysm, dissection, vasculitis
or significant stenosis.

SMA: Patent without evidence of aneurysm, dissection, vasculitis or
significant stenosis.

Renals: Both renal arteries are patent without evidence of aneurysm,
dissection, vasculitis, fibromuscular dysplasia or significant
stenosis.

IMA: Patent without evidence of aneurysm, dissection, vasculitis or
significant stenosis.

Inflow: Patent without evidence of aneurysm, dissection, vasculitis
or significant stenosis.

Veins: No obvious venous abnormality within the limitations of this
arterial phase study.

Review of the MIP images confirms the above findings.

NON-VASCULAR

Hepatobiliary: No focal liver abnormality is seen. No gallstones,
gallbladder wall thickening, or biliary dilatation.

Pancreas: Unremarkable. No pancreatic ductal dilatation or
surrounding inflammatory changes.

Spleen: Normal in size without focal abnormality.

Adrenals/Urinary Tract: Adrenal glands are unremarkable. Kidneys are
normal, without renal calculi, focal lesion, or hydronephrosis. The
urinary bladder is poorly distended and subsequently limited in
evaluation.

Stomach/Bowel: Stomach is within normal limits. Appendix appears
normal. No evidence of bowel wall thickening, distention, or
inflammatory changes.

Lymphatic: No abnormal abdominal or pelvic lymph nodes are
identified.

Reproductive: Uterus and bilateral adnexa are unremarkable.

Other: No abdominal wall hernia or abnormality. No abdominopelvic
ascites.

Musculoskeletal: No acute or significant osseous findings.

Review of the MIP images confirms the above findings.
IMPRESSION: 1. No evidence of pulmonary embolism.
2. Very small right pleural effusion with mild right basilar
atelectasis.
3. No evidence of aneurysmal dilatation, dissection or major vessel
occlusion within the arterial structures of the chest, abdomen and
pelvis.
